# Patient Record
Sex: Female | Born: 1990 | Race: White | Hispanic: No | Marital: Single | Smoking: Never smoker
Health system: Southern US, Community
[De-identification: ages and names within clinical notes are randomized; demographics above are authoritative.]

## PROBLEM LIST (undated history)

## (undated) ENCOUNTER — Inpatient Hospital Stay (HOSPITAL_COMMUNITY): Payer: Self-pay

## (undated) DIAGNOSIS — A6000 Herpesviral infection of urogenital system, unspecified: Secondary | ICD-10-CM

## (undated) DIAGNOSIS — F32A Depression, unspecified: Secondary | ICD-10-CM

## (undated) DIAGNOSIS — F419 Anxiety disorder, unspecified: Secondary | ICD-10-CM

## (undated) DIAGNOSIS — G43909 Migraine, unspecified, not intractable, without status migrainosus: Secondary | ICD-10-CM

## (undated) DIAGNOSIS — F909 Attention-deficit hyperactivity disorder, unspecified type: Secondary | ICD-10-CM

## (undated) HISTORY — DX: Attention-deficit hyperactivity disorder, unspecified type: F90.9

## (undated) HISTORY — PX: NO PAST SURGERIES: SHX2092

## (undated) HISTORY — DX: Anxiety disorder, unspecified: F41.9

## (undated) HISTORY — DX: Migraine, unspecified, not intractable, without status migrainosus: G43.909

---
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
## 2014-10-13 ENCOUNTER — Ambulatory Visit (INDEPENDENT_AMBULATORY_CARE_PROVIDER_SITE_OTHER): Payer: BC Managed Care – PPO | Admitting: Medical

## 2014-10-13 VITALS — BP 120/81 | HR 73 | Temp 98.1°F | Ht 64.5 in | Wt 127.2 lb

## 2014-10-13 DIAGNOSIS — J0100 Acute maxillary sinusitis, unspecified: Secondary | ICD-10-CM

## 2014-10-13 DIAGNOSIS — J329 Chronic sinusitis, unspecified: Secondary | ICD-10-CM | POA: Insufficient documentation

## 2014-10-13 MED ORDER — AZITHROMYCIN 250 MG PO TABS: ORAL_TABLET | ORAL | Status: DC

## 2014-10-13 MED ORDER — FLUTICASONE PROPIONATE 50 MCG/ACT NA SUSP: 2.0000 | Freq: Every day | NASAL | Status: DC

## 2014-10-13 MED ORDER — BENZONATATE 100 MG PO CAPS: 100.0000 mg | ORAL_CAPSULE | Freq: Three times a day (TID) | ORAL | Status: DC | PRN

## 2014-10-13 NOTE — Assessment & Plan Note (Signed)
Your appear to have a sinus infection(with lt om as well). I am prescribing  cedinir antibiotic for the infection. To help with the nasal congestion I prescribed  flonase nasal steroid. For your associated cough, I prescribed cough medicine benzonatate.

## 2014-10-13 NOTE — Progress Notes (Signed)
Pre visit review using our clinic review tool, if applicable. No additional management support is needed unless otherwise documented below in the visit note. 

## 2014-10-13 NOTE — Patient Instructions (Addendum)
Your appear to have a sinus infection(with lt om as well). I am prescribing  Azithromycin  antibiotic for the infection. To help with the nasal congestion I prescribed  flonase nasal steroid. For your associated cough, I prescribed cough medicine benzonatate.  Rest, hydrate, tylenol for fever.  Follow up in 7 days or as needed.

## 2014-10-13 NOTE — Progress Notes (Signed)
   Subjective:    Patient ID: Denise [name], female    DOB: 1991-05-26, 23 y.o.   MRN: [id number]  HPI   Pt in today reporting  cough, nasal congestion, sneezing, itchy eyes  and runny nose for  3 wks. Pt states last week pressure in sinus and ears.   Pt denies history of allergies this time of year but some in the spring.  Associated symptoms( below yes or no)  Fever-no Chills-no Chest congestion-no Sneezing- yes Itching eyes-no Sore throat- faint mild sorethroat Post-nasal drainage-yes Wheezing-no Purulent nasal drainage-yes. Brownish yellow mucous. Fatigue-mild  LMP- 3 wks ago.  No past medical history on file.  History   Social History  . Marital Status: Single    Spouse Name: N/A    Number of Children: N/A  . Years of Education: N/A   Occupational History  . Not on file.   Social History Main Topics  . Smoking status: Not on file  . Smokeless tobacco: Not on file  . Alcohol Use: Not on file  . Drug Use: Not on file  . Sexual Activity: Not on file   Other Topics Concern  . Not on file   Social History Narrative  . No narrative on file    No past surgical history on file.  No family history on file.  Allergies  Allergen Reactions  . Cephalosporins     No current outpatient prescriptions on file prior to visit.   No current facility-administered medications on file prior to visit.    BP 120/81 mmHg  Pulse 73  Temp(Src) 98.1 F (36.7 C) (Oral)  Ht 5' 4.5" (1.638 m)  Wt 127 lb 3.2 oz (57.698 kg)  BMI 21.50 kg/m2  SpO2 100%  LMP 09/13/2014       Review of Systems  Constitutional: Negative for fever, chills and fatigue.  HENT: Positive for congestion, ear pain, postnasal drip, rhinorrhea, sinus pressure, sneezing and sore throat.   Eyes: Positive for itching.  Respiratory: Positive for cough. Negative for wheezing.   Cardiovascular: Negative for chest pain and palpitations.  Musculoskeletal: Negative for back pain and neck pain.    Neurological: Negative for dizziness and headaches.  Hematological: Negative for adenopathy. Does not bruise/bleed easily.       Objective:   Physical Exam  General  Mental Status - Alert. General Appearance - Well groomed. Not in acute distress.  Skin Rashes- No Rashes.  HEENT Head- Normal. Ear Auditory Canal - Left- Normal. Right - Normal.Tympanic Membrane- Left- Normal. Right- moderate bright red and buldge(the one can't hear from) Eye Sclera/Conjunctiva- Left- Normal. Right- Normal. Nose & Sinuses Nasal Mucosa- Left-  Boggy and Congested. Right-  Boggy and  Congested.Bilateral  Very faint maxillary and very faint frontal sinus pressure. Mouth & Throat Lips: Upper Lip- Normal: no dryness, cracking, pallor, cyanosis, or vesicular eruption. Lower Lip-Normal: no dryness, cracking, pallor, cyanosis or vesicular eruption. Buccal Mucosa- Bilateral- No Aphthous ulcers. Oropharynx- No Discharge or Erythema. Tonsils: Characteristics- Bilateral- No Erythema or Congestion. Size/Enlargement- Bilateral- No enlargement. Discharge- bilateral-None.  Neck Neck- Supple. No Masses.   Chest and Lung Exam Auscultation: Breath Sounds:-Clear even and unlabored.  Cardiovascular Auscultation:Rythm- Regular, rate and rhythm. Murmurs & Other Heart Sounds:Ausculatation of the heart reveal- No Murmurs.  Lymphatic Head & Neck General Head & Neck Lymphatics: Bilateral: Description- No Localized lymphadenopathy.         Assessment & Plan:

## 2014-12-21 ENCOUNTER — Ambulatory Visit: Payer: Self-pay | Admitting: Family Medicine

## 2014-12-21 ENCOUNTER — Telehealth: Payer: Self-pay | Admitting: Family Medicine

## 2014-12-21 NOTE — Telephone Encounter (Addendum)
Pt canceled new patient appointment today due to car breaking down

## 2014-12-21 NOTE — Telephone Encounter (Signed)
No fee per Beverely Lowabori, also she advised pt has to be scheduled out for new patient appt. Not an urgent work in.

## 2014-12-23 ENCOUNTER — Telehealth: Payer: Self-pay | Admitting: Family Medicine

## 2014-12-23 ENCOUNTER — Encounter: Payer: Self-pay | Admitting: Family Medicine

## 2014-12-23 NOTE — Telephone Encounter (Signed)
Disregard- show previous phone not regarding

## 2014-12-23 NOTE — Telephone Encounter (Signed)
PT was no show on 02/22 for new PT do you want me to reschedule/ do you want a no show fee?

## 2015-01-01 ENCOUNTER — Telehealth: Payer: Self-pay

## 2015-01-01 NOTE — Telephone Encounter (Signed)
Spoke with patient's mother, Clydie BraunKaren.  Mother states that patient is currently in school at [employer] in [place], [place] and will not be in until Sunday.  Mother was uncertain of patient's current cell number.

## 2015-01-04 ENCOUNTER — Ambulatory Visit (INDEPENDENT_AMBULATORY_CARE_PROVIDER_SITE_OTHER): Payer: BLUE CROSS/BLUE SHIELD | Admitting: Family Medicine

## 2015-01-04 ENCOUNTER — Encounter: Payer: Self-pay | Admitting: Family Medicine

## 2015-01-04 VITALS — BP 122/80 | HR 72 | Temp 98.0°F | Resp 16 | Ht 64.5 in | Wt 125.4 lb

## 2015-01-04 DIAGNOSIS — F909 Attention-deficit hyperactivity disorder, unspecified type: Secondary | ICD-10-CM | POA: Insufficient documentation

## 2015-01-04 DIAGNOSIS — Z30018 Encounter for initial prescription of other contraceptives: Secondary | ICD-10-CM

## 2015-01-04 DIAGNOSIS — IMO0001 Reserved for inherently not codable concepts without codable children: Secondary | ICD-10-CM | POA: Insufficient documentation

## 2015-01-04 DIAGNOSIS — F901 Attention-deficit hyperactivity disorder, predominantly hyperactive type: Secondary | ICD-10-CM

## 2015-01-04 DIAGNOSIS — L42 Pityriasis rosea: Secondary | ICD-10-CM

## 2015-01-04 MED ORDER — TRIAMCINOLONE ACETONIDE 0.1 % EX OINT: 1.0000 "application " | TOPICAL_OINTMENT | Freq: Two times a day (BID) | CUTANEOUS | Status: DC

## 2015-01-04 MED ORDER — ETONOGESTREL-ETHINYL ESTRADIOL 0.12-0.015 MG/24HR VA RING: VAGINAL_RING | VAGINAL | Status: DC

## 2015-01-04 NOTE — Assessment & Plan Note (Signed)
New to provider, ongoing for pt.  Doing well on current dose of Focalin.  Pt reports she does not need meds at this time and should be good until April.  Will refill at that time.

## 2015-01-04 NOTE — Patient Instructions (Signed)
Schedule your complete physical for sometime this summer (when you're home for break) Apply the Triamcinolone ointment twice daily on the itchy areas Insert the NuvaRing on Sunday 3/13.  Remove on April 3rd.  Insert new ring on April 10th. Call with any questions or concerns Welcome!  We're glad to have you!

## 2015-01-04 NOTE — Progress Notes (Signed)
   Subjective:    Patient ID: Denise [name], female    DOB: [birth year], 24 y.o.   MRN: [id number]  HPI New to establish.  Previous MD- Wynelle LinkSun  Skin lesions- pt was [facility] [place] (where she goes to school) and was dx'd w/ pityriasis rosea but pt has scaly, somewhat painful patches in her groin.  Hypopigmented areas on trunk- initially areas were salmon colored but pt is tanning for upcoming cruise and areas are now white.  ADD- chronic problem, currently on Focalin XR 10mg  daily.  Good control of sxs.  Intermittent palpitations.  No anorexia.  Some insomnia if she takes med too late.  Birth control- pt is interested in switching due to fact that pt will frequently forget pills despite setting an alarm.  Interested in NuvaRing.  No hx of blood clots.   Review of Systems For ROS see HPI   Reviewed meds, allergies, problem list, and PMH in chart     Objective:   Physical Exam  Constitutional: She is oriented to person, place, and time. She appears well-developed and well-nourished. No distress.  HENT:  Head: Normocephalic and atraumatic.  Neck: Normal range of motion. Neck supple. No thyromegaly present.  Cardiovascular: Normal rate, regular rhythm, normal heart sounds and intact distal pulses.   Pulmonary/Chest: Effort normal and breath sounds normal. No respiratory distress. She has no wheezes. She has no rales.  Musculoskeletal: She exhibits no edema.  Lymphadenopathy:    She has no cervical adenopathy.  Neurological: She is alert and oriented to person, place, and time.  Skin: Skin is warm and dry. Rash (pt w/ hypopigmented areas on trunk consistent w/ pityriasis rosea.  has areas in groin bilaterally that are heaped up and scaling around the edges and are more pink than white) noted. No erythema.  Vitals reviewed.         Assessment & Plan:

## 2015-01-04 NOTE — Assessment & Plan Note (Signed)
New.  Due to fact that pt frequently misses pills, she is interested in switching to the NuvaRing.  Discussed appropriate start date and how to use ring appropriately.  Pt has no hx of blood clots and is not a smoker.  Prescription given.

## 2015-01-04 NOTE — Progress Notes (Signed)
Pre visit review using our clinic review tool, if applicable. No additional management support is needed unless otherwise documented below in the visit note. 

## 2015-01-04 NOTE — Assessment & Plan Note (Signed)
New.  Agree that spots on trunk are consistent w/ pityriasis but her groin lesions are less consistent and are uncomfortable.  Start Triamcinolone ointment twice daily.  Reviewed supportive care and red flags that should prompt return.  Pt expressed understanding and is in agreement w/ plan.

## 2015-01-20 ENCOUNTER — Telehealth: Payer: Self-pay | Admitting: *Deleted

## 2015-01-20 NOTE — Telephone Encounter (Signed)
Medical records received via fax from [facility]. Forwarded to [facility] and Dr. Beverely Lowabori. JG//CMA

## 2015-03-18 ENCOUNTER — Telehealth: Payer: Self-pay | Admitting: Family Medicine

## 2015-03-18 NOTE — Telephone Encounter (Signed)
°  Caller name:Karen Relationship to patient:mother Can be [phone] Pharmacy:  Reason for call:Would like written rx for focalin xr 10 mg  She would like to pick up 03-23-2015 early am

## 2015-03-22 ENCOUNTER — Telehealth: Payer: Self-pay | Admitting: Family Medicine

## 2015-03-22 MED ORDER — DEXMETHYLPHENIDATE HCL ER 10 MG PO CP24: 10.0000 mg | ORAL_CAPSULE | Freq: Every day | ORAL | Status: DC

## 2015-03-22 NOTE — Telephone Encounter (Signed)
Relation to pt: daughter  Call back number: [phone]   Reason for call:  Pt is requesting a refill dexmethylphenidate (FOCALIN XR) 10 MG 24 hr capsule. Pt is returning back to school today and requesting to pick RX before 12. Advised pt in the future please give office a call prior to running out of medication pt voice understanding. Please advise

## 2015-03-22 NOTE — Telephone Encounter (Signed)
Ok for #30 

## 2015-03-22 NOTE — Telephone Encounter (Signed)
Last OV 01-04-15 focalin was placed on med list 12-11-14 no qty listed.

## 2015-03-22 NOTE — Telephone Encounter (Signed)
Med filled and pt notified available at the front desk for pick up.  

## 2015-04-16 ENCOUNTER — Telehealth: Payer: Self-pay | Admitting: Family Medicine

## 2015-04-16 NOTE — Telephone Encounter (Signed)
Can use Tuesday aug 30th at 11am.

## 2015-04-16 NOTE — Telephone Encounter (Signed)
Relation to pt: self  Call back number:[phone] Pharmacy:  Reason for call:  Pt would like to schedule physical as per 01/04/15 MSX:[id number] your complete physical for sometime this summer (when you're home for break). Pt prefers Monday or Tuesday.  Please advise

## 2015-04-19 ENCOUNTER — Ambulatory Visit (INDEPENDENT_AMBULATORY_CARE_PROVIDER_SITE_OTHER): Payer: BLUE CROSS/BLUE SHIELD | Admitting: Medical

## 2015-04-19 ENCOUNTER — Encounter: Payer: Self-pay | Admitting: General Practice

## 2015-04-19 ENCOUNTER — Encounter: Payer: Self-pay | Admitting: Medical

## 2015-04-19 VITALS — BP 135/78 | HR 81 | Temp 98.6°F | Ht 64.5 in | Wt 122.6 lb

## 2015-04-19 DIAGNOSIS — F909 Attention-deficit hyperactivity disorder, unspecified type: Secondary | ICD-10-CM | POA: Diagnosis not present

## 2015-04-19 DIAGNOSIS — G4700 Insomnia, unspecified: Secondary | ICD-10-CM | POA: Diagnosis not present

## 2015-04-19 MED ORDER — HYDROXYZINE HCL 25 MG PO TABS: ORAL_TABLET | ORAL | Status: DC

## 2015-04-19 MED ORDER — DEXMETHYLPHENIDATE HCL ER 10 MG PO CP24: 10.0000 mg | ORAL_CAPSULE | Freq: Every day | ORAL | Status: DC

## 2015-04-19 NOTE — Progress Notes (Signed)
Pre visit review using our clinic review tool, if applicable. No additional management support is needed unless otherwise documented below in the visit note. 

## 2015-04-19 NOTE — Patient Instructions (Signed)
ADHD (attention deficit hyperactivity disorder) Refill of her focalin today. Pt did sign control med contract today. She can call back in a month to see if pcp wants official visit or if she can just pick up next month refill.  Insomnia Sleep hygiene discussed and rx of hydroxyzine to use if needed.   Pt signed control med contract. Will put it in for scanning.  Follow up 1 month or as needed

## 2015-04-19 NOTE — Assessment & Plan Note (Signed)
Refill of her focalin today. Pt did sign control med contract today. She can call back in a month to see if pcp wants official visit or if she can just pick up next month refill.

## 2015-04-19 NOTE — Telephone Encounter (Signed)
Pt scheduled for aug 30th at 11am. Thank you

## 2015-04-19 NOTE — Progress Notes (Signed)
   Subjective:    Patient ID: Denise [name], female    DOB: [birth year], 24 y.o.   MRN: [id number]  HPI  Pt in for prescription of focalin. She has history ADHD. Pt has been on medication since 2nd grade. Pt doing well in school/College. She is at [employer]. Pt is studying radiology.  Pt states that if she misses occaional day would notice concentration decrease. Takes medication in am.  Pt also mentions that she has some problems going to sleeping. This has been going on for a month or two. Pt has trouble about every other night. Pt does work sometimes late 11 pm- midnight. Production designer, theatre/television/film at [employer].   LMP- last week.      Review of Systems  Constitutional: Negative for fever, chills and fatigue.  Respiratory: Negative for cough, chest tightness, shortness of breath and wheezing.   Cardiovascular: Negative for chest pain and palpitations.  Gastrointestinal: Negative for abdominal pain.  Neurological: Negative for dizziness, seizures, weakness, numbness and headaches.  Hematological: Negative for adenopathy. Does not bruise/bleed easily.  Psychiatric/Behavioral: Positive for sleep disturbance and decreased concentration. Negative for hallucinations, behavioral problems, confusion and dysphoric mood. The patient is not nervous/anxious.     Past Medical History  Diagnosis Date  . ADHD (attention deficit hyperactivity disorder)     History   Social History  . Marital Status: Single    Spouse Name: N/A  . Number of Children: N/A  . Years of Education: N/A   Occupational History  . Not on file.   Social History Main Topics  . Smoking status: Never Smoker   . Smokeless tobacco: Never Used  . Alcohol Use: Yes  . Drug Use: No  . Sexual Activity: Yes   Other Topics Concern  . Not on file   Social History Narrative    No past surgical history on file.  No family history on file.  Allergies  Allergen Reactions  . Cephalosporins     Current Outpatient Prescriptions  on File Prior to Visit  Medication Sig Dispense Refill  . dexmethylphenidate (FOCALIN XR) 10 MG 24 hr capsule Take 1 capsule (10 mg total) by mouth daily. 30 capsule 0  . etonogestrel-ethinyl estradiol (NUVARING) 0.12-0.015 MG/24HR vaginal ring Insert vaginally and leave in place for 3 consecutive weeks, then remove for 1 week. 1 each 12  . triamcinolone ointment (KENALOG) 0.1 % Apply 1 application topically 2 (two) times daily. 90 g 1   No current facility-administered medications on file prior to visit.    BP 135/78 mmHg  Pulse 81  Temp(Src) 98.6 F (37 C) (Oral)  Ht 5' 4.5" (1.638 m)  Wt 122 lb 9.6 oz (55.611 kg)  BMI 20.73 kg/m2  SpO2 100%  LMP 04/12/2015       Objective:   Physical Exam  General Mental Status- Alert. General Appearance- Not in acute distress.   Skin General: Color- Normal Color. Moisture- Normal Moisture.  Neck Carotid Arteries- Normal color. Moisture- Normal Moisture.Marland Kitchen No JVD.  Chest and Lung Exam Auscultation: Breath Sounds:-Normal. CTA.  Cardiovascular Auscultation:Rythm- Regular, Rate and Rhythm. Murmurs & Other Heart Sounds:Auscultation of the heart reveals- No Murmurs.    Neurologic Cranial Nerve exam:- CN III-XII intact(No nystagmus), symmetric smile. Drift Test:- No drift. Romberg Exam:- Negative.  Heal to Toe Gait exam:-Normal. Finger to Nose:- Normal/Intact Strength:- 5/5 equal and symmetric strength both upper and lower extremities.      Assessment & Plan:

## 2015-04-19 NOTE — Assessment & Plan Note (Signed)
Sleep hygiene discussed and rx of hydroxyzine to use if needed.

## 2015-04-20 NOTE — Telephone Encounter (Signed)
By the time the patient returned call Aug 30th at 11am has been taken. Any other dates ?

## 2015-04-21 NOTE — Telephone Encounter (Signed)
LVM awaiting pt to call back to scheduled physical

## 2015-04-21 NOTE — Telephone Encounter (Signed)
Pt can be notified that it is ok to wait for fall break. Ok to use an 11am. This is not urgent to schedule.

## 2015-04-23 ENCOUNTER — Encounter: Payer: BLUE CROSS/BLUE SHIELD | Admitting: Family Medicine

## 2015-05-21 ENCOUNTER — Telehealth: Payer: Self-pay | Admitting: Family Medicine

## 2015-05-21 MED ORDER — DEXMETHYLPHENIDATE HCL ER 10 MG PO CP24: 10.0000 mg | ORAL_CAPSULE | Freq: Every day | ORAL | Status: DC

## 2015-05-21 NOTE — Telephone Encounter (Signed)
Ok for #30, no refills.  Needs UDS 

## 2015-05-21 NOTE — Telephone Encounter (Signed)
Caller name: Leonila Speranza  Relation to pt: self  Call back [phone] Pharmacy: [facility] [address], [place], [place] [address] [phone]    Reason for call:  Patient requesting a refill dexmethylphenidate (FOCALIN XR) 10 MG 24 hr capsule. Please send to new pharmacy

## 2015-05-21 NOTE — Telephone Encounter (Signed)
Rx printed, MD signed, and placed at front desk for pick-up.  Left message on voicemail to notify patient.

## 2015-05-21 NOTE — Telephone Encounter (Signed)
Last refilled 04/19/15 for 30 with 0 Last seen by Esperanza Richters, PA  04/19/15 for ADD Controlled Substance contract signed 04/19/15, no UDS Please advise.

## 2015-06-09 ENCOUNTER — Other Ambulatory Visit: Payer: Self-pay

## 2015-06-09 NOTE — Telephone Encounter (Signed)
Pt was refilled on 7/22- too early

## 2015-06-10 NOTE — Telephone Encounter (Signed)
Faxed pharmacy regarding early refill refused.

## 2015-10-21 ENCOUNTER — Encounter: Payer: BLUE CROSS/BLUE SHIELD | Admitting: Family Medicine

## 2015-10-26 ENCOUNTER — Telehealth: Payer: Self-pay | Admitting: Family Medicine

## 2015-10-26 NOTE — Telephone Encounter (Signed)
Pt never picked up rx dated for 05-21-15, rx shredded

## 2016-09-13 LAB — OB RESULTS CONSOLE HGB/HCT, BLOOD
HEMATOCRIT: 40 %
Hemoglobin: 13.1 g/dL

## 2016-09-13 LAB — OB RESULTS CONSOLE RPR: RPR: NONREACTIVE

## 2016-09-13 LAB — OB RESULTS CONSOLE GC/CHLAMYDIA
Chlamydia: NEGATIVE
GC PROBE AMP, GENITAL: NEGATIVE

## 2016-09-13 LAB — OB RESULTS CONSOLE HEPATITIS B SURFACE ANTIGEN: Hepatitis B Surface Ag: NEGATIVE

## 2016-09-13 LAB — OB RESULTS CONSOLE HIV ANTIBODY (ROUTINE TESTING): HIV: NONREACTIVE

## 2016-09-13 LAB — OB RESULTS CONSOLE RUBELLA ANTIBODY, IGM: Rubella: NON-IMMUNE/NOT IMMUNE

## 2016-09-13 LAB — OB RESULTS CONSOLE ABO/RH: RH Type: NEGATIVE

## 2016-09-13 LAB — OB RESULTS CONSOLE ANTIBODY SCREEN: Antibody Screen: NEGATIVE

## 2016-09-13 LAB — OB RESULTS CONSOLE PLATELET COUNT: PLATELETS: 275 10*3/uL

## 2016-09-25 LAB — CYTOLOGY - PAP: Pap: NEGATIVE

## 2016-10-16 ENCOUNTER — Ambulatory Visit: Payer: BLUE CROSS/BLUE SHIELD | Admitting: *Deleted

## 2016-10-16 ENCOUNTER — Ambulatory Visit: Payer: BLUE CROSS/BLUE SHIELD

## 2016-10-30 NOTE — L&D Delivery Note (Signed)
26 y.o. G1P0000 at 8578w5d delivered a viable female infant in cephalic, OP position. anterior shoulder delivered with ease. 60 sec delayed cord clamping. Cord clamped x2 and cut. Placenta delivered spontaneously intact, with 3VC. Fundus firm on exam with massage and pitocin. Good hemostasis noted.  Anesthesia: Epidural Laceration: 2nd degree perineal extending to left labia Suture: 3.0 Vicryl Good hemostasis noted. EBL: 200cc  Mom and baby recovering in LDR.    Apgars: APGAR (1 MIN): 8   APGAR (5 MINS): 9    Weight: Pending skin to skin  Sponge and instrument count were correct x2. Placenta sent to L&D pathology.  Howard PouchLauren Feng, MD PGY-1 Family Medicine 04/18/2017, 8:46 PM  OB FELLOW DELIVERY ATTESTATION  I was gloved and present for the delivery in its entirety, and I agree with the above resident's note.    Jen MowElizabeth Mumaw, DO OB Fellow

## 2016-11-06 ENCOUNTER — Encounter: Payer: Self-pay | Admitting: *Deleted

## 2016-11-06 ENCOUNTER — Encounter: Payer: BLUE CROSS/BLUE SHIELD | Attending: Obstetrics and Gynecology | Admitting: *Deleted

## 2016-11-06 DIAGNOSIS — Z3490 Encounter for supervision of normal pregnancy, unspecified, unspecified trimester: Secondary | ICD-10-CM | POA: Diagnosis not present

## 2016-11-06 DIAGNOSIS — Z713 Dietary counseling and surveillance: Secondary | ICD-10-CM | POA: Diagnosis not present

## 2016-11-06 DIAGNOSIS — Z789 Other specified health status: Secondary | ICD-10-CM

## 2016-11-06 NOTE — Progress Notes (Signed)
  Medical Nutrition Therapy:  Appt start time: 1115 end time:  1200.   Assessment:  Primary concerns today: Denise [name] is here for nutrition counseling pertaining to referral for vegetarian diet while pregnant.  Denise [name] wants to make sure she is getting enough nutrients.  She has been a vegetarian since age 89.  She has not been educated on vegetarian diets.  04/20/17 due date.  Has lost weight 3 lb unintentionally.  She was struggling with vomiting until 2 weeks ago.  She threw up multiple times daily.   She didn't eat well before and felt terribly.  Now she is eating more regularly.  Eats 3 meals and 1 snacks.  She lives with mom who does the grocery shopping and the cooking.  Mom eats meat, but will cook vegetarian for [facility].  She just moved back in with mom for a month.  She eats out most days: [employer] potato soft taco (no beans), fries, [place] grits and biscuit or hashbrown.  Is trying to limit soda.  States she is a picky eater.  Doesn't eat seafood and doesn't like certain vegetables.  Like rice green beans, greens, soup, PB sandwiches, yogurt, salad.  Will consume dairy, but not eggs. Loves [place] food.    Preferred Learning Style:   No preference indicated   Learning Readiness:   Ready   MEDICATIONS: pnv   DIETARY INTAKE: Usual eating pattern includes 3 meals and 1 snacks per day.  Avoided foods include meat, tofu, seafood, eggs, brussel sprouts.    24-hr recall:  B ( AM): cheerios with whole milk  Snk ( AM): CIB L ( PM): minestrone soup Snk ( PM): none D ( PM): cheese fries Snk ( PM): yogurt parfait (vanilla yogurt, frozen fruit, granola) Beverages: water  Usual physical activity: treadmill 3 days/week.       Nutritional Diagnosis:  NI-5.7.1 Inadequate protein intake As related to vegetarian diet.  As evidenced by dietary recall.    Intervention:  Nutrition counseling provided.  Discussed need for increased protein, zinc, iron, magnesium, and overall balanced  nutrition. Discussed alternative protein sources, increasing fruits and vegetables and whole grains, and making more nutrient-rich choices when eating out.    Teaching Method Utilized:  Auditory   Handouts given during visit include:  MNT for vegetarian while pregnant  Barriers to learning/adherence to lifestyle change: none  Demonstrated degree of understanding via:  Teach Back   Monitoring/Evaluation:  Dietary intake, exercise, labs, and body weight prn.

## 2016-11-06 NOTE — Patient Instructions (Signed)
Check out [employer] products Eat protein with all meals and snacks Bars and carnation are fine Other sources are beans (get at [employer]), nuts and peanut butter, dairy Drink lots of water Eat a variety of fruits and vegetable Try whole grains like brown rice, whole wheat bread Add second snack

## 2016-11-21 ENCOUNTER — Encounter: Payer: Self-pay | Admitting: Obstetrics

## 2016-11-21 ENCOUNTER — Ambulatory Visit (INDEPENDENT_AMBULATORY_CARE_PROVIDER_SITE_OTHER): Payer: BLUE CROSS/BLUE SHIELD | Admitting: Obstetrics

## 2016-11-21 VITALS — BP 113/74 | HR 90 | Wt 125.6 lb

## 2016-11-21 DIAGNOSIS — Z3490 Encounter for supervision of normal pregnancy, unspecified, unspecified trimester: Secondary | ICD-10-CM | POA: Insufficient documentation

## 2016-11-21 DIAGNOSIS — Z3482 Encounter for supervision of other normal pregnancy, second trimester: Secondary | ICD-10-CM

## 2016-11-21 DIAGNOSIS — Z3480 Encounter for supervision of other normal pregnancy, unspecified trimester: Secondary | ICD-10-CM

## 2016-11-21 NOTE — Progress Notes (Signed)
Subjective:    Denise [name] is a 26 y.o. female being seen today for her obstetrical visit. She is at 8931w4d gestation. Patient reports: no complaints . Fetal movement: normal.  Problem List Items Addressed This Visit    None     Patient Active Problem List   Diagnosis Date Noted  . Supervision of normal pregnancy, antepartum 11/21/2016  . Insomnia 04/19/2015  . ADHD (attention deficit hyperactivity disorder) 01/04/2015  . Pityriasis rosea 01/04/2015  . Birth control 01/04/2015  . Sinus infection 10/13/2014   Objective:    BP 113/74   Pulse 90   Wt 125 lb 9.6 oz (57 kg)   BMI 21.23 kg/m  FHT: 150 BPM  Uterine Size: size equals dates     Assessment:    Pregnancy @ 4031w4d    Plan:    Signs and symptoms of preterm labor: discussed.  Labs, problem list reviewed and updated 2 hr GTT planned Follow up in 4 weeks.  Patient ID: Denise [name], female   DOB: [birth year], 26 y.o.   MRN: [id number]

## 2016-11-21 NOTE — Progress Notes (Signed)
Patient is doing well and has no concerns.Patient needs to reschedule her anatomy ultrasound.

## 2016-11-22 ENCOUNTER — Encounter: Payer: Self-pay | Admitting: *Deleted

## 2016-11-22 ENCOUNTER — Encounter: Payer: BLUE CROSS/BLUE SHIELD | Admitting: Obstetrics & Gynecology

## 2016-11-29 ENCOUNTER — Ambulatory Visit (HOSPITAL_COMMUNITY)
Admission: RE | Admit: 2016-11-29 | Discharge: 2016-11-29 | Disposition: A | Discharge status: Home / Self Care | Payer: BLUE CROSS/BLUE SHIELD | Source: Ambulatory Visit | Attending: Obstetrics | Admitting: Obstetrics

## 2016-11-29 ENCOUNTER — Other Ambulatory Visit: Payer: Self-pay | Admitting: Obstetrics

## 2016-11-29 DIAGNOSIS — Z363 Encounter for antenatal screening for malformations: Secondary | ICD-10-CM | POA: Diagnosis not present

## 2016-11-29 DIAGNOSIS — Z3A19 19 weeks gestation of pregnancy: Secondary | ICD-10-CM | POA: Diagnosis not present

## 2016-11-29 DIAGNOSIS — Z3480 Encounter for supervision of other normal pregnancy, unspecified trimester: Secondary | ICD-10-CM

## 2016-11-30 ENCOUNTER — Encounter: Payer: Self-pay | Admitting: *Deleted

## 2016-11-30 DIAGNOSIS — Z3490 Encounter for supervision of normal pregnancy, unspecified, unspecified trimester: Secondary | ICD-10-CM

## 2016-12-08 ENCOUNTER — Telehealth: Payer: Self-pay

## 2016-12-08 NOTE — Telephone Encounter (Signed)
Pt c/o possible stomach bug. Initiate the SUPERVALU INCBRAT diet, increase fluid intake, Tylenol as directed for pain, Imodium as directed for diarrhea and allow virus to run it's course. Contact MAU if sx's worsen or feels dehydrated.

## 2016-12-19 ENCOUNTER — Encounter: Payer: Self-pay | Admitting: Obstetrics

## 2016-12-19 ENCOUNTER — Ambulatory Visit (INDEPENDENT_AMBULATORY_CARE_PROVIDER_SITE_OTHER): Payer: BLUE CROSS/BLUE SHIELD | Admitting: Obstetrics

## 2016-12-19 VITALS — BP 114/70 | HR 98 | Wt 128.0 lb

## 2016-12-19 DIAGNOSIS — B3731 Acute candidiasis of vulva and vagina: Secondary | ICD-10-CM

## 2016-12-19 DIAGNOSIS — O98812 Other maternal infectious and parasitic diseases complicating pregnancy, second trimester: Secondary | ICD-10-CM

## 2016-12-19 DIAGNOSIS — B373 Candidiasis of vulva and vagina: Secondary | ICD-10-CM

## 2016-12-19 DIAGNOSIS — Z3480 Encounter for supervision of other normal pregnancy, unspecified trimester: Secondary | ICD-10-CM

## 2016-12-19 MED ORDER — FLUCONAZOLE 150 MG PO TABS
150.0000 mg | ORAL_TABLET | Freq: Once | ORAL | 0 refills | Status: AC
Start: 2016-12-19 — End: 2016-12-19

## 2016-12-19 NOTE — Progress Notes (Signed)
Patient thinks she has a yeast infection. Patient also has a severe cold.

## 2016-12-19 NOTE — Progress Notes (Signed)
Subjective:  Denise [name] is a 26 y.o. G1P0 at 685w4d being seen today for ongoing prenatal care.  She is currently monitored for the following issues for this low-risk pregnancy and has Sinus infection; ADHD (attention deficit hyperactivity disorder); Pityriasis rosea; Birth control; Insomnia; and Supervision of normal pregnancy, antepartum on her problem list.  Patient reports vaginal irritation and URI.  Contractions: Not present. Vag. Bleeding: None.  Movement: Present. Denies leaking of fluid.   The following portions of the patient's history were reviewed and updated as appropriate: allergies, current medications, past family history, past medical history, past social history, past surgical history and problem list. Problem list updated.  Objective:   Vitals:   12/19/16 1049  BP: 114/70  Pulse: 98  Weight: 128 lb (58.1 kg)    Fetal Status:     Movement: Present     General:  Alert, oriented and cooperative. Patient is in no acute distress.  Skin: Skin is warm and dry. No rash noted.   Cardiovascular: Normal heart rate noted  Respiratory: Normal respiratory effort, no problems with respiration noted  Abdomen: Soft, gravid, appropriate for gestational age. Pain/Pressure: Absent     Pelvic:  Cervical exam deferred        Extremities: Normal range of motion.  Edema: None  Mental Status: Normal mood and affect. Normal behavior. Normal judgment and thought content.   Urinalysis:      Assessment and Plan:  1. Pregnancy: G1P0 at 705w4d  2. URI -comfort measures recommended  There are no diagnoses linked to this encounter. Preterm labor symptoms and general obstetric precautions including but not limited to vaginal bleeding, contractions, leaking of fluid and fetal movement were reviewed in detail with the patient. Please refer to After Visit Summary for other counseling recommendations.  F/U 4 weeks   Brock Badharles A Harper, MDPatient ID: Denise [name], female   DOB: October 02, 1991,  26 y.o.   MRN: [id number]

## 2016-12-23 ENCOUNTER — Encounter (HOSPITAL_COMMUNITY): Payer: Self-pay | Admitting: *Deleted

## 2016-12-23 ENCOUNTER — Inpatient Hospital Stay (HOSPITAL_COMMUNITY)
Admission: AD | Admit: 2016-12-23 | Discharge: 2016-12-23 | Disposition: A | Discharge status: Home / Self Care | Payer: BLUE CROSS/BLUE SHIELD | Source: Ambulatory Visit | Attending: Obstetrics and Gynecology | Admitting: Obstetrics and Gynecology

## 2016-12-23 DIAGNOSIS — F909 Attention-deficit hyperactivity disorder, unspecified type: Secondary | ICD-10-CM | POA: Diagnosis not present

## 2016-12-23 DIAGNOSIS — N898 Other specified noninflammatory disorders of vagina: Secondary | ICD-10-CM | POA: Insufficient documentation

## 2016-12-23 DIAGNOSIS — F419 Anxiety disorder, unspecified: Secondary | ICD-10-CM | POA: Insufficient documentation

## 2016-12-23 DIAGNOSIS — O99342 Other mental disorders complicating pregnancy, second trimester: Secondary | ICD-10-CM | POA: Insufficient documentation

## 2016-12-23 DIAGNOSIS — Z3A23 23 weeks gestation of pregnancy: Secondary | ICD-10-CM | POA: Diagnosis not present

## 2016-12-23 DIAGNOSIS — O26892 Other specified pregnancy related conditions, second trimester: Secondary | ICD-10-CM | POA: Diagnosis not present

## 2016-12-23 LAB — WET PREP, GENITAL
CLUE CELLS WET PREP: NONE SEEN
SPERM: NONE SEEN
TRICH WET PREP: NONE SEEN
YEAST WET PREP: NONE SEEN

## 2016-12-23 LAB — URINALYSIS, ROUTINE W REFLEX MICROSCOPIC
BILIRUBIN URINE: NEGATIVE
Glucose, UA: NEGATIVE mg/dL
Hgb urine dipstick: NEGATIVE
KETONES UR: NEGATIVE mg/dL
NITRITE: NEGATIVE
PROTEIN: NEGATIVE mg/dL
Specific Gravity, Urine: 1.002 — ABNORMAL LOW (ref 1.005–1.030)
pH: 8 (ref 5.0–8.0)

## 2016-12-23 NOTE — MAU Provider Note (Signed)
  History     CSN: [id number]  Arrival date and time: 12/23/16 1123   None     Chief Complaint  Patient presents with  . Vaginal Discharge   Patient is a 26 y/o G1P0 at 23wks and 1 day who presents with concerns for vaginal discharge. She was seen in the office yesterday and told her provider she had vaginal discharge and itching. She received a dose of fluconazole. Overnight she reports a copious amount of greenish thick vaginal discharge in her panties when she woke up this AM. She reports no vaginal discharge, no contractions or abdominal pain. She reports regular fetal movement. She has history of HSV 1.     OB History    Gravida Para Term Preterm AB Living   1             SAB TAB Ectopic Multiple Live Births                  Past Medical History:  Diagnosis Date  . ADHD (attention deficit hyperactivity disorder)   . Anxiety     History reviewed. No pertinent surgical history.  Family History  Problem Relation Age of Onset  . Multiple sclerosis Mother   . Cancer Mother   . Cancer Other   . Stroke Other     Social History  Substance Use Topics  . Smoking status: Never Smoker  . Smokeless tobacco: Never Used  . Alcohol use No    Allergies:  Allergies  Allergen Reactions  . Cephalosporins     Prescriptions Prior to Admission  Medication Sig Dispense Refill Last Dose  . Prenatal Vit-Fe Fumarate-FA (PRENATAL MULTIVITAMIN) TABS tablet Take 1 tablet by mouth daily at 12 noon.   Taking    Review of Systems  Constitutional: Negative for chills and fever.  HENT: Negative for congestion and rhinorrhea.   Respiratory: Negative for cough and shortness of breath.   Cardiovascular: Negative for chest pain and leg swelling.  Gastrointestinal: Negative for abdominal distention, abdominal pain, constipation, diarrhea, nausea and vomiting.  Genitourinary: Negative for difficulty urinating, dysuria and flank pain.  Neurological: Negative for dizziness, weakness and  light-headedness.   Physical Exam   Blood pressure 117/59, pulse 87, temperature 98.4 F (36.9 C), temperature source Oral, resp. rate 16, height 5\' 3"  (1.6 m), weight 127 lb 1.9 oz (57.7 kg).  Physical Exam  Constitutional: She is oriented to person, place, and time. She appears well-developed and well-nourished.  HENT:  Head: Normocephalic and atraumatic.  Respiratory: Effort normal. No respiratory distress.  GI: Soft. Bowel sounds are normal. She exhibits no distension. There is no rebound.  Genitourinary:  Genitourinary Comments: Small amount of whitish discharge in vaginal vault. Cervix visually and digitally closed. No blood in vaginal vault. No cervical motion tenderness.   Neurological: She is alert and oriented to person, place, and time.  Skin: Skin is warm and dry.  Psychiatric: She has a normal mood and affect. Her behavior is normal.    MAU Course  Procedures  MDM In [place] patient -had vaginal exam which was unremarkable -Wet prep which was negative -GC/CT which is pending.   Assessment and Plan  Vaginal Discharge: Negative wet prep await GC/CT cultures. No intervention at this time. Return precautions given for bleeding, decreased fetal movement or contractions.   Ernestina Pennaicholas Carlton Sweaney 12/23/2016, 12:22 PM

## 2016-12-23 NOTE — MAU Note (Signed)
Saw doctor on Wednesday was given Diflucan for yeast, last night had large amount of vaginal discharge, denies bleeding, still having discharge, has HSV 1 having a flare up.

## 2016-12-23 NOTE — Discharge Instructions (Signed)
Cervicitis Cervicitis is when the cervix gets irritated and swollen. Your cervix is the lower end of your uterus. Follow these instructions at home:  Do not have sex until your doctor says it is okay.  Take over-the-counter and prescription medicines only as told by your doctor.  If you were prescribed an antibiotic medicine, take it as told by your doctor. Do not stop taking it even if you start to feel better.  Keep all follow-up visits as told by your doctor. This is important. Contact a doctor if:  Your symptoms come back after treatment.  Your symptoms get worse after treatment.  You have a fever.  You feel tired (fatigued).  Your belly (abdomen) hurts.  You feel like you are going to throw up (are nauseous).  You throw up (vomit).  You have watery poop (diarrhea).  Your back hurts. Get help right away if:  You have very bad pain in your belly, and medicine does not help it.  You cannot pee (urinate). Summary  Cervicitis is when the cervix gets irritated and swollen.  Do not have sex until your doctor says it is okay.  If you need to take an antibiotic, do not stop taking even if you start to feel better. Take medicines only as told by your doctor. This information is not intended to replace advice given to you by your health care provider. Make sure you discuss any questions you have with your health care provider. Document Released: 07/25/2008 Document Revised: 07/02/2016 Document Reviewed: 07/02/2016 Elsevier Interactive Patient Education  2017 Elsevier Inc.  

## 2016-12-25 LAB — GC/CHLAMYDIA PROBE AMP (~~LOC~~) NOT AT ARMC
Chlamydia: NEGATIVE
Neisseria Gonorrhea: NEGATIVE

## 2017-01-16 ENCOUNTER — Ambulatory Visit (INDEPENDENT_AMBULATORY_CARE_PROVIDER_SITE_OTHER): Payer: Medicaid Other | Admitting: Obstetrics

## 2017-01-16 ENCOUNTER — Encounter: Payer: Self-pay | Admitting: Obstetrics

## 2017-01-16 ENCOUNTER — Encounter: Payer: Self-pay | Admitting: *Deleted

## 2017-01-16 DIAGNOSIS — Z3402 Encounter for supervision of normal first pregnancy, second trimester: Secondary | ICD-10-CM

## 2017-01-16 DIAGNOSIS — O360920 Maternal care for other rhesus isoimmunization, second trimester, not applicable or unspecified: Secondary | ICD-10-CM

## 2017-01-16 DIAGNOSIS — Z3480 Encounter for supervision of other normal pregnancy, unspecified trimester: Secondary | ICD-10-CM

## 2017-01-16 DIAGNOSIS — Z6791 Unspecified blood type, Rh negative: Secondary | ICD-10-CM | POA: Insufficient documentation

## 2017-01-16 DIAGNOSIS — O26899 Other specified pregnancy related conditions, unspecified trimester: Secondary | ICD-10-CM | POA: Insufficient documentation

## 2017-01-16 NOTE — Progress Notes (Signed)
Subjective:  Denise [name] is a 26 y.o. G1P0 at 4159w4d being seen today for ongoing prenatal care.  She is currently monitored for the following issues for this low-risk pregnancy and has Sinus infection; ADHD (attention deficit hyperactivity disorder); Pityriasis rosea; Birth control; Insomnia; Supervision of normal pregnancy, antepartum; and Rh negative, antepartum on her problem list.  Patient reports no complaints.  Contractions: Irregular. Vag. Bleeding: None.  Movement: Present. Denies leaking of fluid.   The following portions of the patient's history were reviewed and updated as appropriate: allergies, current medications, past family history, past medical history, past social history, past surgical history and problem list. Problem list updated.  Objective:   Vitals:   01/16/17 1046  BP: 117/76  Pulse: 82  Weight: 133 lb 12.8 oz (60.7 kg)    Fetal Status: Fetal Heart Rate (bpm): 150   Movement: Present     General:  Alert, oriented and cooperative. Patient is in no acute distress.  Skin: Skin is warm and dry. No rash noted.   Cardiovascular: Normal heart rate noted  Respiratory: Normal respiratory effort, no problems with respiration noted  Abdomen: Soft, gravid, appropriate for gestational age. Pain/Pressure: Absent     Pelvic:  Cervical exam deferred        Extremities: Normal range of motion.  Edema: None  Mental Status: Normal mood and affect. Normal behavior. Normal judgment and thought content.   Urinalysis:      Assessment and Plan:  Pregnancy: G1P0 at 2759w4d  1. Supervision of other normal pregnancy, antepartum   2. Rh negative, antepartum Rx: - Rhogam ar 28 weeks  Preterm labor symptoms and general obstetric precautions including but not limited to vaginal bleeding, contractions, leaking of fluid and fetal movement were reviewed in detail with the patient. Please refer to After Visit Summary for other counseling recommendations.  F/U in 2 weeks   Brock Badharles  A Harper, MDPatient ID: Denise [name], female   DOB: 05-10-1991, 26 y.o.   MRN: [id number]

## 2017-01-16 NOTE — Progress Notes (Signed)
Pt presents for ROB requests note to be [employer] [place] [employer].

## 2017-01-29 ENCOUNTER — Ambulatory Visit (INDEPENDENT_AMBULATORY_CARE_PROVIDER_SITE_OTHER): Payer: BLUE CROSS/BLUE SHIELD | Admitting: Obstetrics & Gynecology

## 2017-01-29 ENCOUNTER — Other Ambulatory Visit: Payer: Medicaid Other

## 2017-01-29 VITALS — BP 127/71 | HR 88 | Wt 135.2 lb

## 2017-01-29 DIAGNOSIS — Z23 Encounter for immunization: Secondary | ICD-10-CM

## 2017-01-29 DIAGNOSIS — O99613 Diseases of the digestive system complicating pregnancy, third trimester: Secondary | ICD-10-CM

## 2017-01-29 DIAGNOSIS — Z3480 Encounter for supervision of other normal pregnancy, unspecified trimester: Secondary | ICD-10-CM

## 2017-01-29 DIAGNOSIS — K219 Gastro-esophageal reflux disease without esophagitis: Secondary | ICD-10-CM

## 2017-01-29 DIAGNOSIS — Z6791 Unspecified blood type, Rh negative: Secondary | ICD-10-CM

## 2017-01-29 DIAGNOSIS — O360930 Maternal care for other rhesus isoimmunization, third trimester, not applicable or unspecified: Secondary | ICD-10-CM

## 2017-01-29 DIAGNOSIS — Z3483 Encounter for supervision of other normal pregnancy, third trimester: Secondary | ICD-10-CM

## 2017-01-29 DIAGNOSIS — O26899 Other specified pregnancy related conditions, unspecified trimester: Secondary | ICD-10-CM

## 2017-01-29 DIAGNOSIS — O219 Vomiting of pregnancy, unspecified: Secondary | ICD-10-CM

## 2017-01-29 MED ORDER — FAMOTIDINE 20 MG PO TABS: 20.0000 mg | ORAL_TABLET | Freq: Two times a day (BID) | ORAL | 5 refills | Status: DC

## 2017-01-29 MED ORDER — METOCLOPRAMIDE HCL 10 MG PO TABS: 10.0000 mg | ORAL_TABLET | Freq: Four times a day (QID) | ORAL | 5 refills | Status: DC | PRN

## 2017-01-29 MED ORDER — ONDANSETRON 4 MG PO TBDP: 4.0000 mg | ORAL_TABLET | Freq: Four times a day (QID) | ORAL | 5 refills | Status: DC | PRN

## 2017-01-29 MED ORDER — RHO D IMMUNE GLOBULIN 1500 UNIT/2ML IJ SOSY
300.0000 ug | PREFILLED_SYRINGE | Freq: Once | INTRAMUSCULAR | Status: AC
  Administered 2017-01-29: 300 ug via INTRAMUSCULAR

## 2017-01-29 NOTE — Progress Notes (Signed)
   PRENATAL VISIT NOTE  Subjective:  Denise [name] is a 26 y.o. G1P0 at [redacted]w[redacted]d being seen today for ongoing prenatal care.  She is currently monitored for the following issues for this low-risk pregnancy and has Sinus infection; ADHD (attention deficit hyperactivity disorder); Pityriasis rosea; Birth control; Insomnia; Supervision of normal pregnancy, antepartum; and Rh negative, antepartum on her problem list.  Patient reports heartburn, nausea and vomiting that is getting worse, wants medication.  Contractions: Not present. Vag. Bleeding: None.  Movement: Present. Denies leaking of fluid.   The following portions of the patient's history were reviewed and updated as appropriate: allergies, current medications, past family history, past medical history, past social history, past surgical history and problem list. Problem list updated.  Objective:   Vitals:   01/29/17 0924  BP: 127/71  Pulse: 88  Weight: 135 lb 3.2 oz (61.3 kg)    Fetal Status: Fetal Heart Rate (bpm): 145 Fundal Height: 28 cm Movement: Present     General:  Alert, oriented and cooperative. Patient is in no acute distress.  Skin: Skin is warm and dry. No rash noted.   Cardiovascular: Normal heart rate noted  Respiratory: Normal respiratory effort, no problems with respiration noted  Abdomen: Soft, gravid, appropriate for gestational age. Pain/Pressure: Absent     Pelvic:  Cervical exam deferred        Extremities: Normal range of motion.  Edema: None  Mental Status: Normal mood and affect. Normal behavior. Normal judgment and thought content.   Assessment and Plan:  Pregnancy: G1P0 at [redacted]w[redacted]d  1. Nausea and vomiting of pregnancy, antepartum Antiemetics prescribed. - ondansetron (ZOFRAN ODT) 4 MG disintegrating tablet; Take 1 tablet (4 mg total) by mouth every 6 (six) hours as needed for nausea.  Dispense: 20 tablet; Refill: 5 - metoCLOPramide (REGLAN) 10 MG tablet; Take 1 tablet (10 mg total) by mouth 4 (four) times  daily as needed for nausea or vomiting.  Dispense: 30 tablet; Refill: 5  2. Gastroesophageal reflux in pregnancy in third trimester - famotidine (PEPCID) 20 MG tablet; Take 1 tablet (20 mg total) by mouth 2 (two) times daily.  Dispense: 60 tablet; Refill: 5  3. Rh negative, antepartum Rhogam given today. - Antibody screen - rho (d) immune globulin (RHIG/RHOPHYLAC) injection 300 mcg; Inject 2 mLs (300 mcg total) into the muscle once.  4. Need for Tdap vaccination - Tdap vaccine greater than or equal to 7yo IM  5. Flu vaccine need - Flu Vaccine QUAD 36+ mos IM  6. Supervision of other normal pregnancy, antepartum Third trimester labs done today, will follow up results and manage accordingly. - Glucose Tolerance, 2 Hours w/1 Hour - HIV antibody - RPR - CBC Preterm labor symptoms and general obstetric precautions including but not limited to vaginal bleeding, contractions, leaking of fluid and fetal movement were reviewed in detail with the patient. Please refer to After Visit Summary for other counseling recommendations.  Return in about 2 weeks (around 02/12/2017) for OB Visit.   Tereso Newcomer, MD

## 2017-01-29 NOTE — Patient Instructions (Signed)
Return to clinic for any scheduled appointments or obstetric concerns, or go to MAU for evaluation   Third Trimester of Pregnancy The third trimester is from week 28 through week 40 (months 7 through 9). The third trimester is a time when the unborn baby (fetus) is growing rapidly. At the end of the ninth month, the fetus is about 20 inches in length and weighs 6-10 pounds. Body changes during your third trimester Your body will continue to go through many changes during pregnancy. The changes vary from woman to woman. During the third trimester:  Your weight will continue to increase. You can expect to gain 25-35 pounds (11-16 kg) by the end of the pregnancy.  You may begin to get stretch marks on your hips, abdomen, and breasts.  You may urinate more often because the fetus is moving lower into your pelvis and pressing on your bladder.  You may develop or continue to have heartburn. This is caused by increased hormones that slow down muscles in the digestive tract.  You may develop or continue to have constipation because increased hormones slow digestion and cause the muscles that push waste through your intestines to relax.  You may develop hemorrhoids. These are swollen veins (varicose veins) in the rectum that can itch or be painful.  You may develop swollen, bulging veins (varicose veins) in your legs.  You may have increased body aches in the pelvis, back, or thighs. This is due to weight gain and increased hormones that are relaxing your joints.  You may have changes in your hair. These can include thickening of your hair, rapid growth, and changes in texture. Some women also have hair loss during or after pregnancy, or hair that feels dry or thin. Your hair will most likely return to normal after your baby is born.  Your breasts will continue to grow and they will continue to become tender. A yellow fluid (colostrum) may leak from your breasts. This is the first milk you are  producing for your baby.  Your belly button may stick out.  You may notice more swelling in your hands, face, or ankles.  You may have increased tingling or numbness in your hands, arms, and legs. The skin on your belly may also feel numb.  You may feel short of breath because of your expanding uterus.  You may have more problems sleeping. This can be caused by the size of your belly, increased need to urinate, and an increase in your body's metabolism.  You may notice the fetus "dropping," or moving lower in your abdomen (lightening).  You may have increased vaginal discharge.  You may notice your joints feel loose and you may have pain around your pelvic bone.  What to expect at prenatal visits You will have prenatal exams every 2 weeks until week 36. Then you will have weekly prenatal exams. During a routine prenatal visit:  You will be weighed to make sure you and the baby are growing normally.  Your blood pressure will be taken.  Your abdomen will be measured to track your baby's growth.  The fetal heartbeat will be listened to.  Any test results from the previous visit will be discussed.  You may have a cervical check near your due date to see if your cervix has softened or thinned (effaced).  You will be tested for Group B streptococcus. This happens between 35 and 37 weeks.  Your health care provider may ask you:  What your birth plan is.    How you are feeling.  If you are feeling the baby move.  If you have had any abnormal symptoms, such as leaking fluid, bleeding, severe headaches, or abdominal cramping.  If you are using any tobacco products, including cigarettes, chewing tobacco, and electronic cigarettes.  If you have any questions.  Other tests or screenings that may be performed during your third trimester include:  Blood tests that check for low iron levels (anemia).  Fetal testing to check the health, activity level, and growth of the fetus.  Testing is done if you have certain medical conditions or if there are problems during the pregnancy.  Nonstress test (NST). This test checks the health of your baby to make sure there are no signs of problems, such as the baby not getting enough oxygen. During this test, a belt is placed around your belly. The baby is made to move, and its heart rate is monitored during movement.  What is false labor? False labor is a condition in which you feel small, irregular tightenings of the muscles in the womb (contractions) that usually go away with rest, changing position, or drinking water. These are called Braxton Hicks contractions. Contractions may last for hours, days, or even weeks before true labor sets in. If contractions come at regular intervals, become more frequent, increase in intensity, or become painful, you should see your health care provider. What are the signs of labor?  Abdominal cramps.  Regular contractions that start at 10 minutes apart and become stronger and more frequent with time.  Contractions that start on the top of the uterus and spread down to the lower abdomen and back.  Increased pelvic pressure and dull back pain.  A watery or bloody mucus discharge that comes from the vagina.  Leaking of amniotic fluid. This is also known as your "water breaking." It could be a slow trickle or a gush. Let your health care provider know if it has a color or strange odor. If you have any of these signs, call your health care provider right away, even if it is before your due date. Follow these instructions at home: Medicines  Follow your health care provider's instructions regarding medicine use. Specific medicines may be either safe or unsafe to take during pregnancy.  Take a prenatal vitamin that contains at least 600 micrograms (mcg) of folic acid.  If you develop constipation, try taking a stool softener if your health care provider approves. Eating and drinking  Eat a  balanced diet that includes fresh fruits and vegetables, whole grains, good sources of protein such as meat, eggs, or tofu, and low-fat dairy. Your health care provider will help you determine the amount of weight gain that is right for you.  Avoid raw meat and uncooked cheese. These carry germs that can cause birth defects in the baby.  If you have low calcium intake from food, talk to your health care provider about whether you should take a daily calcium supplement.  Eat four or five small meals rather than three large meals a day.  Limit foods that are high in fat and processed sugars, such as fried and sweet foods.  To prevent constipation: ? Drink enough fluid to keep your urine clear or pale yellow. ? Eat foods that are high in fiber, such as fresh fruits and vegetables, whole grains, and beans. Activity  Exercise only as directed by your health care provider. Most women can continue their usual exercise routine during pregnancy. Try to exercise for 30   minutes at least 5 days a week. Stop exercising if you experience uterine contractions.  Avoid heavy lifting.  Do not exercise in extreme heat or humidity, or at high altitudes.  Wear low-heel, comfortable shoes.  Practice good posture.  You may continue to have sex unless your health care provider tells you otherwise. Relieving pain and discomfort  Take frequent breaks and rest with your legs elevated if you have leg cramps or low back pain.  Take warm sitz baths to soothe any pain or discomfort caused by hemorrhoids. Use hemorrhoid cream if your health care provider approves.  Wear a good support bra to prevent discomfort from breast tenderness.  If you develop varicose veins: ? Wear support pantyhose or compression stockings as told by your healthcare provider. ? Elevate your feet for 15 minutes, 3-4 times a day. Prenatal care  Write down your questions. Take them to your prenatal visits.  Keep all your prenatal  visits as told by your health care provider. This is important. Safety  Wear your seat belt at all times when driving.  Make a list of emergency phone numbers, including numbers for family, friends, the hospital, and police and fire departments. General instructions  Avoid cat litter boxes and soil used by cats. These carry germs that can cause birth defects in the baby. If you have a cat, ask someone to clean the litter box for you.  Do not travel far distances unless it is absolutely necessary and only with the approval of your health care provider.  Do not use hot tubs, steam rooms, or saunas.  Do not drink alcohol.  Do not use any products that contain nicotine or tobacco, such as cigarettes and e-cigarettes. If you need help quitting, ask your health care provider.  Do not use any medicinal herbs or unprescribed drugs. These chemicals affect the formation and growth of the baby.  Do not douche or use tampons or scented sanitary pads.  Do not cross your legs for long periods of time.  To prepare for the arrival of your baby: ? Take prenatal classes to understand, practice, and ask questions about labor and delivery. ? Make a trial run to the hospital. ? Visit the hospital and tour the maternity area. ? Arrange for maternity or paternity leave through employers. ? Arrange for family and friends to take care of pets while you are in the hospital. ? Purchase a rear-facing car seat and make sure you know how to install it in your car. ? Pack your hospital bag. ? Prepare the baby's nursery. Make sure to remove all pillows and stuffed animals from the baby's crib to prevent suffocation.  Visit your dentist if you have not gone during your pregnancy. Use a soft toothbrush to brush your teeth and be gentle when you floss. Contact a health care provider if:  You are unsure if you are in labor or if your water has broken.  You become dizzy.  You have mild pelvic cramps, pelvic  pressure, or nagging pain in your abdominal area.  You have lower back pain.  You have persistent nausea, vomiting, or diarrhea.  You have an unusual or bad smelling vaginal discharge.  You have pain when you urinate. Get help right away if:  Your water breaks before 37 weeks.  You have regular contractions less than 5 minutes apart before 37 weeks.  You have a fever.  You are leaking fluid from your vagina.  You have spotting or bleeding from your vagina.    You have severe abdominal pain or cramping.  You have rapid weight loss or weight gain.  You have shortness of breath with chest pain.  You notice sudden or extreme swelling of your face, hands, ankles, feet, or legs.  Your baby makes fewer than 10 movements in 2 hours.  You have severe headaches that do not go away when you take medicine.  You have vision changes. Summary  The third trimester is from week 28 through week 40, months 7 through 9. The third trimester is a time when the unborn baby (fetus) is growing rapidly.  During the third trimester, your discomfort may increase as you and your baby continue to gain weight. You may have abdominal, leg, and back pain, sleeping problems, and an increased need to urinate.  During the third trimester your breasts will keep growing and they will continue to become tender. A yellow fluid (colostrum) may leak from your breasts. This is the first milk you are producing for your baby.  False labor is a condition in which you feel small, irregular tightenings of the muscles in the womb (contractions) that eventually go away. These are called Braxton Hicks contractions. Contractions may last for hours, days, or even weeks before true labor sets in.  Signs of labor can include: abdominal cramps; regular contractions that start at 10 minutes apart and become stronger and more frequent with time; watery or bloody mucus discharge that comes from the vagina; increased pelvic pressure  and dull back pain; and leaking of amniotic fluid. This information is not intended to replace advice given to you by your health care provider. Make sure you discuss any questions you have with your health care provider. Document Released: 10/10/2001 Document Revised: 03/23/2016 Document Reviewed: 12/17/2012 Elsevier Interactive Patient Education  2017 [employer]  

## 2017-01-29 NOTE — Progress Notes (Signed)
Pt presents for ROB, 2 hr gtt, ABx screen, Rhogam, flu vaccine and Tdap. Pt states morning N&V has returned.

## 2017-01-30 LAB — GLUCOSE TOLERANCE, 2 HOURS W/ 1HR
GLUCOSE, 2 HOUR: 60 mg/dL — AB (ref 65–152)
GLUCOSE, FASTING: 81 mg/dL (ref 65–91)
Glucose, 1 hour: 86 mg/dL (ref 65–179)

## 2017-01-30 LAB — CBC
Hematocrit: 32.6 % — ABNORMAL LOW (ref 34.0–46.6)
Hemoglobin: 11 g/dL — ABNORMAL LOW (ref 11.1–15.9)
MCH: 30.3 pg (ref 26.6–33.0)
MCHC: 33.7 g/dL (ref 31.5–35.7)
MCV: 90 fL (ref 79–97)
PLATELETS: 282 10*3/uL (ref 150–379)
RBC: 3.63 x10E6/uL — AB (ref 3.77–5.28)
RDW: 14.3 % (ref 12.3–15.4)
WBC: 6.1 10*3/uL (ref 3.4–10.8)

## 2017-01-30 LAB — ANTIBODY SCREEN: ANTIBODY SCREEN: NEGATIVE

## 2017-01-30 LAB — HIV ANTIBODY (ROUTINE TESTING W REFLEX): HIV SCREEN 4TH GENERATION: NONREACTIVE

## 2017-01-30 LAB — RPR: RPR Ser Ql: NONREACTIVE

## 2017-02-13 ENCOUNTER — Ambulatory Visit (INDEPENDENT_AMBULATORY_CARE_PROVIDER_SITE_OTHER): Payer: Medicaid Other | Admitting: Obstetrics and Gynecology

## 2017-02-13 VITALS — BP 105/74 | HR 71 | Wt 134.0 lb

## 2017-02-13 DIAGNOSIS — Z3403 Encounter for supervision of normal first pregnancy, third trimester: Secondary | ICD-10-CM

## 2017-02-13 DIAGNOSIS — Z6791 Unspecified blood type, Rh negative: Secondary | ICD-10-CM

## 2017-02-13 DIAGNOSIS — O26899 Other specified pregnancy related conditions, unspecified trimester: Secondary | ICD-10-CM

## 2017-02-13 DIAGNOSIS — O360930 Maternal care for other rhesus isoimmunization, third trimester, not applicable or unspecified: Secondary | ICD-10-CM

## 2017-02-13 DIAGNOSIS — Z3400 Encounter for supervision of normal first pregnancy, unspecified trimester: Secondary | ICD-10-CM

## 2017-02-13 NOTE — Progress Notes (Signed)
Subjective:  Denise [name] is a 26 y.o. G1P0 at [redacted]w[redacted]d being seen today for ongoing prenatal care.  She is currently monitored for the following issues for this low-risk pregnancy and has ADHD (attention deficit hyperactivity disorder); Pityriasis rosea; Insomnia; Supervision of normal pregnancy, antepartum; and Rh negative, antepartum on her problem list.  Patient reports no complaints.  Contractions: Not present. Vag. Bleeding: None.  Movement: Present. Denies leaking of fluid.   The following portions of the patient's history were reviewed and updated as appropriate: allergies, current medications, past family history, past medical history, past social history, past surgical history and problem list. Problem list updated.  Objective:   Vitals:   02/13/17 0950  BP: 105/74  Pulse: 71  Weight: 134 lb (60.8 kg)    Fetal Status: Fetal Heart Rate (bpm): 140   Movement: Present     General:  Alert, oriented and cooperative. Patient is in no acute distress.  Skin: Skin is warm and dry. No rash noted.   Cardiovascular: Normal heart rate noted  Respiratory: Normal respiratory effort, no problems with respiration noted  Abdomen: Soft, gravid, appropriate for gestational age. Pain/Pressure: Absent     Pelvic:  Cervical exam deferred        Extremities: Normal range of motion.  Edema: None  Mental Status: Normal mood and affect. Normal behavior. Normal judgment and thought content.   Urinalysis:      Assessment and Plan:  Pregnancy: G1P0 at [redacted]w[redacted]d  1. Supervision of normal first pregnancy, antepartum Stable N/V from last visit has resolved  2. Rh negative, antepartum Rhogam today  Preterm labor symptoms and general obstetric precautions including but not limited to vaginal bleeding, contractions, leaking of fluid and fetal movement were reviewed in detail with the patient. Please refer to After Visit Summary for other counseling recommendations.  Return in about 2 weeks (around  02/27/2017) for OB visit.   Hermina Staggers, MD

## 2017-02-27 ENCOUNTER — Ambulatory Visit (INDEPENDENT_AMBULATORY_CARE_PROVIDER_SITE_OTHER): Payer: Medicaid Other | Admitting: Obstetrics and Gynecology

## 2017-02-27 VITALS — BP 109/75 | HR 80 | Wt 137.9 lb

## 2017-02-27 DIAGNOSIS — Z3483 Encounter for supervision of other normal pregnancy, third trimester: Secondary | ICD-10-CM

## 2017-02-27 DIAGNOSIS — O26899 Other specified pregnancy related conditions, unspecified trimester: Principal | ICD-10-CM

## 2017-02-27 DIAGNOSIS — Z6791 Unspecified blood type, Rh negative: Secondary | ICD-10-CM

## 2017-02-27 DIAGNOSIS — Z3480 Encounter for supervision of other normal pregnancy, unspecified trimester: Secondary | ICD-10-CM

## 2017-02-27 NOTE — Progress Notes (Signed)
Subjective:  Denise [name] is a 26 y.o. G1P0 at 102w4d being seen today for ongoing prenatal care.  She is currently monitored for the following issues for this low-risk pregnancy and has ADHD (attention deficit hyperactivity disorder); Pityriasis rosea; Insomnia; Supervision of normal pregnancy, antepartum; and Rh negative, antepartum on her problem list.  Patient reports no complaints.  Contractions: Not present. Vag. Bleeding: None.  Movement: Present. Denies leaking of fluid.   The following portions of the patient's history were reviewed and updated as appropriate: allergies, current medications, past family history, past medical history, past social history, past surgical history and problem list. Problem list updated.  Objective:   Vitals:   02/27/17 1304  BP: 109/75  Pulse: 80  Weight: 62.6 kg (137 lb 14.4 oz)    Fetal Status: Fetal Heart Rate (bpm): 128   Movement: Present     General:  Alert, oriented and cooperative. Patient is in no acute distress.  Skin: Skin is warm and dry. No rash noted.   Cardiovascular: Normal heart rate noted  Respiratory: Normal respiratory effort, no problems with respiration noted  Abdomen: Soft, gravid, appropriate for gestational age. Pain/Pressure: Absent     Pelvic:  Cervical exam deferred        Extremities: Normal range of motion.  Edema: Trace  Mental Status: Normal mood and affect. Normal behavior. Normal judgment and thought content.   Urinalysis:      Assessment and Plan:  Pregnancy: G1P0 at [redacted]w[redacted]d  1. Rh negative, antepartum S/P Rhogam  2. Supervision of other normal pregnancy, antepartum Stable  Preterm labor symptoms and general obstetric precautions including but not limited to vaginal bleeding, contractions, leaking of fluid and fetal movement were reviewed in detail with the patient. Please refer to After Visit Summary for other counseling recommendations.  Return in about 2 weeks (around 03/13/2017) for OB  visit.   Hermina Staggers, MD

## 2017-03-13 ENCOUNTER — Ambulatory Visit (INDEPENDENT_AMBULATORY_CARE_PROVIDER_SITE_OTHER): Payer: Medicaid Other | Admitting: Obstetrics & Gynecology

## 2017-03-13 VITALS — BP 121/78 | HR 80 | Wt 138.8 lb

## 2017-03-13 DIAGNOSIS — Z3400 Encounter for supervision of normal first pregnancy, unspecified trimester: Secondary | ICD-10-CM

## 2017-03-13 DIAGNOSIS — Z3403 Encounter for supervision of normal first pregnancy, third trimester: Secondary | ICD-10-CM

## 2017-03-13 NOTE — Progress Notes (Signed)
   PRENATAL VISIT NOTE  Subjective:  Denise [name] is a 26 y.o. G1P0 at 6659w4d being seen today for ongoing prenatal care.  She is currently monitored for the following issues for this low-risk pregnancy and has ADHD (attention deficit hyperactivity disorder); Pityriasis rosea; Insomnia; Supervision of normal pregnancy, antepartum; and Rh negative, antepartum on her problem list.  Patient reports no complaints.  Contractions: Not present. Vag. Bleeding: None.  Movement: Present. Denies leaking of fluid.   The following portions of the patient's history were reviewed and updated as appropriate: allergies, current medications, past family history, past medical history, past social history, past surgical history and problem list. Problem list updated.  Objective:   Vitals:   03/13/17 0943  BP: 121/78  Pulse: 80  Weight: 63 kg (138 lb 12.8 oz)    Fetal Status: Fetal Heart Rate (bpm): 130 Fundal Height: 34 cm Movement: Present     General:  Alert, oriented and cooperative. Patient is in no acute distress.  Skin: Skin is warm and dry. No rash noted.   Cardiovascular: Normal heart rate noted  Respiratory: Normal respiratory effort, no problems with respiration noted  Abdomen: Soft, gravid, appropriate for gestational age. Pain/Pressure: Absent     Pelvic:  Cervical exam deferred        Extremities: Normal range of motion.  Edema: None  Mental Status: Normal mood and affect. Normal behavior. Normal judgment and thought content.   Assessment and Plan:  Pregnancy: G1P0 at 5159w4d  1. Supervision of normal first pregnancy, antepartum Doing well  Preterm labor symptoms and general obstetric precautions including but not limited to vaginal bleeding, contractions, leaking of fluid and fetal movement were reviewed in detail with the patient. Please refer to After Visit Summary for other counseling recommendations.  Return in about 1 week (around 03/20/2017).   Adam PhenixArnold, Sabastion Hrdlicka G, MD

## 2017-03-13 NOTE — Patient Instructions (Signed)

## 2017-03-13 NOTE — Progress Notes (Signed)
Patient reports no concerns.

## 2017-03-21 ENCOUNTER — Other Ambulatory Visit (HOSPITAL_COMMUNITY)
Admission: RE | Admit: 2017-03-21 | Discharge: 2017-03-21 | Disposition: A | Discharge status: Home / Self Care | Payer: Medicaid Other | Source: Ambulatory Visit | Attending: Obstetrics and Gynecology | Admitting: Obstetrics and Gynecology

## 2017-03-21 ENCOUNTER — Ambulatory Visit (INDEPENDENT_AMBULATORY_CARE_PROVIDER_SITE_OTHER): Payer: Medicaid Other | Admitting: Obstetrics and Gynecology

## 2017-03-21 VITALS — BP 125/78 | HR 93 | Wt 140.0 lb

## 2017-03-21 DIAGNOSIS — Z3492 Encounter for supervision of normal pregnancy, unspecified, second trimester: Secondary | ICD-10-CM

## 2017-03-21 DIAGNOSIS — Z6791 Unspecified blood type, Rh negative: Secondary | ICD-10-CM

## 2017-03-21 DIAGNOSIS — O26899 Other specified pregnancy related conditions, unspecified trimester: Secondary | ICD-10-CM

## 2017-03-21 DIAGNOSIS — Z3403 Encounter for supervision of normal first pregnancy, third trimester: Secondary | ICD-10-CM | POA: Diagnosis not present

## 2017-03-21 DIAGNOSIS — Z3A35 35 weeks gestation of pregnancy: Secondary | ICD-10-CM | POA: Insufficient documentation

## 2017-03-21 DIAGNOSIS — Z3400 Encounter for supervision of normal first pregnancy, unspecified trimester: Secondary | ICD-10-CM

## 2017-03-21 LAB — OB RESULTS CONSOLE GBS: GBS: POSITIVE

## 2017-03-21 NOTE — Progress Notes (Signed)
Subjective:  Denise [name] is a 26 y.o. G1P0 at 7422w5d being seen today for ongoing prenatal care.  She is currently monitored for the following issues for this low-risk pregnancy and has ADHD (attention deficit hyperactivity disorder); Pityriasis rosea; Insomnia; Supervision of normal pregnancy, antepartum; and Rh negative, antepartum on her problem list.  Patient reports no complaints.  Contractions: Not present. Vag. Bleeding: None.  Movement: Present. Denies leaking of fluid.   The following portions of the patient's history were reviewed and updated as appropriate: allergies, current medications, past family history, past medical history, past social history, past surgical history and problem list. Problem list updated.  Objective:   Vitals:   03/21/17 1611  BP: 125/78  Pulse: 93  Weight: 140 lb (63.5 kg)    Fetal Status: Fetal Heart Rate (bpm): 145   Movement: Present     General:  Alert, oriented and cooperative. Patient is in no acute distress.  Skin: Skin is warm and dry. No rash noted.   Cardiovascular: Normal heart rate noted  Respiratory: Normal respiratory effort, no problems with respiration noted  Abdomen: Soft, gravid, appropriate for gestational age. Pain/Pressure: Present     Pelvic:  Cervical exam performed        Extremities: Normal range of motion.  Edema: Trace  Mental Status: Normal mood and affect. Normal behavior. Normal judgment and thought content.   Urinalysis:      Assessment and Plan:  Pregnancy: G1P0 at 8722w5d  1. Supervision of normal first pregnancy, antepartum  - Strep Gp B NAA - Cervicovaginal ancillary only  2. Rh negative, antepartum S/P Rhogam  Preterm labor symptoms and general obstetric precautions including but not limited to vaginal bleeding, contractions, leaking of fluid and fetal movement were reviewed in detail with the patient. Please refer to After Visit Summary for other counseling recommendations.  Return in about 1 week  (around 03/28/2017) for OB visit.   Hermina StaggersErvin, Revia Nghiem L, MD

## 2017-03-22 LAB — CERVICOVAGINAL ANCILLARY ONLY
CHLAMYDIA, DNA PROBE: NEGATIVE
NEISSERIA GONORRHEA: NEGATIVE

## 2017-03-27 LAB — STREP GP B SUSCEPTIBILITY

## 2017-03-27 LAB — STREP GP B CULTURE+RFLX: Strep Gp B Culture+Rflx: POSITIVE — AB

## 2017-03-29 ENCOUNTER — Ambulatory Visit (INDEPENDENT_AMBULATORY_CARE_PROVIDER_SITE_OTHER): Payer: Medicaid Other | Admitting: Obstetrics and Gynecology

## 2017-03-29 VITALS — BP 135/84 | HR 76 | Wt 139.0 lb

## 2017-03-29 DIAGNOSIS — O99820 Streptococcus B carrier state complicating pregnancy: Secondary | ICD-10-CM

## 2017-03-29 DIAGNOSIS — Z3403 Encounter for supervision of normal first pregnancy, third trimester: Secondary | ICD-10-CM

## 2017-03-29 DIAGNOSIS — Z3400 Encounter for supervision of normal first pregnancy, unspecified trimester: Secondary | ICD-10-CM

## 2017-03-29 DIAGNOSIS — Z8619 Personal history of other infectious and parasitic diseases: Secondary | ICD-10-CM

## 2017-03-29 DIAGNOSIS — O09893 Supervision of other high risk pregnancies, third trimester: Secondary | ICD-10-CM

## 2017-03-29 DIAGNOSIS — Z6791 Unspecified blood type, Rh negative: Secondary | ICD-10-CM

## 2017-03-29 DIAGNOSIS — O26899 Other specified pregnancy related conditions, unspecified trimester: Principal | ICD-10-CM

## 2017-03-29 MED ORDER — VALACYCLOVIR HCL 500 MG PO TABS: 500.0000 mg | ORAL_TABLET | Freq: Two times a day (BID) | ORAL | 6 refills | Status: DC

## 2017-03-29 NOTE — Progress Notes (Signed)
   PRENATAL VISIT NOTE  Subjective:  Denise [name] is a 26 y.o. G1P0 at 3331w6d being seen today for ongoing prenatal care.  She is currently monitored for the following issues for this low-risk pregnancy and has ADHD (attention deficit hyperactivity disorder); Pityriasis rosea; Insomnia; Supervision of normal pregnancy, antepartum; Rh negative, antepartum; and GBS (group B Streptococcus carrier), +RV culture, currently pregnant on her problem list.  Patient reports no complaints.  Contractions: Irregular. Vag. Bleeding: None.  Movement: Present. Denies leaking of fluid.   The following portions of the patient's history were reviewed and updated as appropriate: allergies, current medications, past family history, past medical history, past social history, past surgical history and problem list. Problem list updated.  Objective:   Vitals:   03/29/17 0920  BP: 135/84  Pulse: 76  Weight: 139 lb (63 kg)    Fetal Status: Fetal Heart Rate (bpm): 135 Fundal Height: 35 cm Movement: Present     General:  Alert, oriented and cooperative. Patient is in no acute distress.  Skin: Skin is warm and dry. No rash noted.   Cardiovascular: Normal heart rate noted  Respiratory: Normal respiratory effort, no problems with respiration noted  Abdomen: Soft, gravid, appropriate for gestational age. Pain/Pressure: Present     Pelvic:  Cervical exam deferred        Extremities: Normal range of motion.  Edema: Trace  Mental Status: Normal mood and affect. Normal behavior. Normal judgment and thought content.   Assessment and Plan:  Pregnancy: G1P0 at 2031w6d  1. Supervision of normal first pregnancy, antepartum Patient is doing well without complaints Patient with history of herpes- Rx for Valtrex provided  2. Rh negative, antepartum S/p rhogam  3. GBS (group B Streptococcus carrier), +RV culture, currently pregnant Patient informed of carrier status Will provide prophylaxis in labor  Preterm labor  symptoms and general obstetric precautions including but not limited to vaginal bleeding, contractions, leaking of fluid and fetal movement were reviewed in detail with the patient. Please refer to After Visit Summary for other counseling recommendations.  Return in about 1 week (around 04/05/2017) for ROB.   Catalina AntiguaPeggy Dez Stauffer, MD

## 2017-04-06 ENCOUNTER — Ambulatory Visit (INDEPENDENT_AMBULATORY_CARE_PROVIDER_SITE_OTHER): Payer: Medicaid Other | Admitting: Certified Nurse Midwife

## 2017-04-06 ENCOUNTER — Telehealth: Payer: Self-pay

## 2017-04-06 VITALS — BP 123/78 | HR 68 | Wt 141.5 lb

## 2017-04-06 DIAGNOSIS — Z3400 Encounter for supervision of normal first pregnancy, unspecified trimester: Secondary | ICD-10-CM

## 2017-04-06 DIAGNOSIS — O26899 Other specified pregnancy related conditions, unspecified trimester: Secondary | ICD-10-CM

## 2017-04-06 DIAGNOSIS — Z3403 Encounter for supervision of normal first pregnancy, third trimester: Secondary | ICD-10-CM

## 2017-04-06 DIAGNOSIS — O09893 Supervision of other high risk pregnancies, third trimester: Secondary | ICD-10-CM

## 2017-04-06 DIAGNOSIS — O99820 Streptococcus B carrier state complicating pregnancy: Secondary | ICD-10-CM

## 2017-04-06 DIAGNOSIS — Z6791 Unspecified blood type, Rh negative: Secondary | ICD-10-CM

## 2017-04-06 DIAGNOSIS — Z8619 Personal history of other infectious and parasitic diseases: Secondary | ICD-10-CM

## 2017-04-06 NOTE — Progress Notes (Signed)
   PRENATAL VISIT NOTE  Subjective:  Denise [name] is a 26 y.o. G1P0 at 3887w0d being seen today for ongoing prenatal care.  She is currently monitored for the following issues for this low-risk pregnancy and has ADHD (attention deficit hyperactivity disorder); Pityriasis rosea; Insomnia; Supervision of normal pregnancy, antepartum; Rh negative, antepartum; GBS (group B Streptococcus carrier), +RV culture, currently pregnant; and History of herpes genitalis on her problem list.  Patient reports no complaints.  Contractions: Not present. Vag. Bleeding: None.  Movement: Present. Denies leaking of fluid.   The following portions of the patient's history were reviewed and updated as appropriate: allergies, current medications, past family history, past medical history, past social history, past surgical history and problem list. Problem list updated.  Objective:   Vitals:   04/06/17 0817  BP: 123/78  Pulse: 68  Weight: 141 lb 8 oz (64.2 kg)    Fetal Status: Fetal Heart Rate (bpm): 126 Fundal Height: 35 cm Movement: Present  Presentation: Vertex  General:  Alert, oriented and cooperative. Patient is in no acute distress.  Skin: Skin is warm and dry. No rash noted.   Cardiovascular: Normal heart rate noted  Respiratory: Normal respiratory effort, no problems with respiration noted  Abdomen: Soft, gravid, appropriate for gestational age. Pain/Pressure: Absent     Pelvic:  Cervical exam performed Dilation: 1 Effacement (%): Thick Station: -3  Extremities: Normal range of motion.  Edema: Trace  Mental Status: Normal mood and affect. Normal behavior. Normal judgment and thought content.   Assessment and Plan:  Pregnancy: G1P0 at 7287w0d  1. Supervision of normal first pregnancy, antepartum     Doing well  2. Rh negative, antepartum      Rhogam given 01/29/17  3. GBS (group B Streptococcus carrier), +RV culture, currently pregnant      PCN for labor/delivery  4. History of herpes  genitalis      Taking Valtrex, no active lesions.   Term labor symptoms and general obstetric precautions including but not limited to vaginal bleeding, contractions, leaking of fluid and fetal movement were reviewed in detail with the patient. Please refer to After Visit Summary for other counseling recommendations.  Return in about 1 week (around 04/13/2017) for ROB.   Roe Coombsachelle A Gerhart Ruggieri, CNM

## 2017-04-06 NOTE — Progress Notes (Signed)
Patient reports good fetal movement, denies contractions. 

## 2017-04-06 NOTE — Telephone Encounter (Signed)
Left mess on VM that we returned her call and that the offices closes at 12:pm today.  She did not state a reason for her call to us.

## 2017-04-11 ENCOUNTER — Ambulatory Visit (INDEPENDENT_AMBULATORY_CARE_PROVIDER_SITE_OTHER): Payer: Medicaid Other | Admitting: Obstetrics & Gynecology

## 2017-04-11 VITALS — BP 128/83 | HR 76 | Wt 138.0 lb

## 2017-04-11 DIAGNOSIS — Z3403 Encounter for supervision of normal first pregnancy, third trimester: Secondary | ICD-10-CM

## 2017-04-11 DIAGNOSIS — Z3400 Encounter for supervision of normal first pregnancy, unspecified trimester: Secondary | ICD-10-CM

## 2017-04-11 NOTE — Progress Notes (Signed)
   PRENATAL VISIT NOTE  Subjective:  Denise [name] is a 26 y.o. G1P0 at 1736w5d being seen today for ongoing prenatal care.  She is currently monitored for the following issues for this low-risk pregnancy and has ADHD (attention deficit hyperactivity disorder); Pityriasis rosea; Insomnia; Supervision of normal pregnancy, antepartum; Rh negative, antepartum; GBS (group B Streptococcus carrier), +RV culture, currently pregnant; and History of herpes genitalis on her problem list.  Patient reports no complaints.  Contractions: Irregular. Vag. Bleeding: None.  Movement: Present. Denies leaking of fluid.   The following portions of the patient's history were reviewed and updated as appropriate: allergies, current medications, past family history, past medical history, past social history, past surgical history and problem list. Problem list updated.  Objective:   Vitals:   04/11/17 1346  BP: 128/83  Pulse: 76  Weight: 138 lb (62.6 kg)    Fetal Status: Fetal Heart Rate (bpm): 150 Fundal Height: 35 cm Movement: Present  Presentation: Vertex  General:  Alert, oriented and cooperative. Patient is in no acute distress.  Skin: Skin is warm and dry. No rash noted.   Cardiovascular: Normal heart rate noted  Respiratory: Normal respiratory effort, no problems with respiration noted  Abdomen: Soft, gravid, appropriate for gestational age. Pain/Pressure: Present     Pelvic:  Cervical exam performed Dilation: 1.5 Effacement (%): 60 Station: -3  Extremities: Normal range of motion.  Edema: Trace  Mental Status: Normal mood and affect. Normal behavior. Normal judgment and thought content.   Assessment and Plan:  Pregnancy: G1P0 at 5436w5d  1. Supervision of normal first pregnancy, antepartum   Term labor symptoms and general obstetric precautions including but not limited to vaginal bleeding, contractions, leaking of fluid and fetal movement were reviewed in detail with the patient. Please refer to  After Visit Summary for other counseling recommendations.  Return in about 1 week (around 04/18/2017).   Scheryl DarterJames Nairi Oswald, MD

## 2017-04-11 NOTE — Patient Instructions (Signed)
Vaginal Delivery Vaginal delivery means that you will give birth by pushing your baby out of your birth canal (vagina). A team of health care providers will help you before, during, and after vaginal delivery. Birth experiences are unique for every woman and every pregnancy, and birth experiences vary depending on where you choose to give birth. What should I do to prepare for my baby's birth? Before your baby is born, it is important to talk with your health care provider about:  Your labor and delivery preferences. These may include: ? Medicines that you may be given. ? How you will manage your pain. This might include non-medical pain relief techniques or injectable pain relief such as epidural analgesia. ? How you and your baby will be monitored during labor and delivery. ? Who may be in the labor and delivery room with you. ? Your feelings about surgical delivery of your baby (cesarean delivery, or C-section) if this becomes necessary. ? Your feelings about receiving donated blood through an IV tube (blood transfusion) if this becomes necessary.  Whether you are able: ? To take pictures or videos of the birth. ? To eat during labor and delivery. ? To move around, walk, or change positions during labor and delivery.  What to expect after your baby is born, such as: ? Whether delayed umbilical cord clamping and cutting is offered. ? Who will care for your baby right after birth. ? Medicines or tests that may be recommended for your baby. ? Whether breastfeeding is supported in your hospital or birth center. ? How long you will be in the hospital or birth center.  How any medical conditions you have may affect your baby or your labor and delivery experience.  To prepare for your baby's birth, you should also:  Attend all of your health care visits before delivery (prenatal visits) as recommended by your health care provider. This is important.  Prepare your home for your baby's  arrival. Make sure that you have: ? Diapers. ? Baby clothing. ? Feeding equipment. ? Safe sleeping arrangements for you and your baby.  Install a car seat in your vehicle. Have your car seat checked by a certified car seat installer to make sure that it is installed safely.  Think about who will help you with your new baby at home for at least the first several weeks after delivery.  What can I expect when I arrive at the birth center or hospital? Once you are in labor and have been admitted into the hospital or birth center, your health care provider may:  Review your pregnancy history and any concerns you have.  Insert an IV tube into one of your veins. This is used to give you fluids and medicines.  Check your blood pressure, pulse, temperature, and heart rate (vital signs).  Check whether your bag of water (amniotic sac) has broken (ruptured).  Talk with you about your birth plan and discuss pain control options.  Monitoring Your health care provider may monitor your contractions (uterine monitoring) and your baby's heart rate (fetal monitoring). You may need to be monitored:  Often, but not continuously (intermittently).  All the time or for long periods at a time (continuously). Continuous monitoring may be needed if: ? You are taking certain medicines, such as medicine to relieve pain or make your contractions stronger. ? You have pregnancy or labor complications.  Monitoring may be done by:  Placing a special stethoscope or a handheld monitoring device on your abdomen to   check your baby's heartbeat, and feeling your abdomen for contractions. This method of monitoring does not continuously record your baby's heartbeat or your contractions.  Placing monitors on your abdomen (external monitors) to record your baby's heartbeat and the frequency and length of contractions. You may not have to wear external monitors all the time.  Placing monitors inside of your uterus  (internal monitors) to record your baby's heartbeat and the frequency, length, and strength of your contractions. ? Your health care provider may use internal monitors if he or she needs more information about the strength of your contractions or your baby's heart rate. ? Internal monitors are put in place by passing a thin, flexible wire through your vagina and into your uterus. Depending on the type of monitor, it may remain in your uterus or on your baby's head until birth. ? Your health care provider will discuss the benefits and risks of internal monitoring with you and will ask for your permission before inserting the monitors.  Telemetry. This is a type of continuous monitoring that can be done with external or internal monitors. Instead of having to stay in bed, you are able to move around during telemetry. Ask your health care provider if telemetry is an option for you.  Physical exam Your health care provider may perform a physical exam. This may include:  Checking whether your baby is positioned: ? With the head toward your vagina (head-down). This is most common. ? With the head toward the top of your uterus (head-up or breech). If your baby is in a breech position, your health care provider may try to turn your baby to a head-down position so you can deliver vaginally. If it does not seem that your baby can be born vaginally, your provider may recommend surgery to deliver your baby. In rare cases, you may be able to deliver vaginally if your baby is head-up (breech delivery). ? Lying sideways (transverse). Babies that are lying sideways cannot be delivered vaginally.  Checking your cervix to determine: ? Whether it is thinning out (effacing). ? Whether it is opening up (dilating). ? How low your baby has moved into your birth canal.  What are the three stages of labor and delivery?  Normal labor and delivery is divided into the following three stages: Stage 1  Stage 1 is the  longest stage of labor, and it can last for hours or days. Stage 1 includes: ? Early labor. This is when contractions may be irregular, or regular and mild. Generally, early labor contractions are more than 10 minutes apart. ? Active labor. This is when contractions get longer, more regular, more frequent, and more intense. ? The transition phase. This is when contractions happen very close together, are very intense, and may last longer than during any other part of labor.  Contractions generally feel mild, infrequent, and irregular at first. They get stronger, more frequent (about every 2-3 minutes), and more regular as you progress from early labor through active labor and transition.  Many women progress through stage 1 naturally, but you may need help to continue making progress. If this happens, your health care provider may talk with you about: ? Rupturing your amniotic sac if it has not ruptured yet. ? Giving you medicine to help make your contractions stronger and more frequent.  Stage 1 ends when your cervix is completely dilated to 4 inches (10 cm) and completely effaced. This happens at the end of the transition phase. Stage 2  Once   your cervix is completely effaced and dilated to 4 inches (10 cm), you may start to feel an urge to push. It is common for the body to naturally take a rest before feeling the urge to push, especially if you received an epidural or certain other pain medicines. This rest period may last for up to 1-2 hours, depending on your unique labor experience.  During stage 2, contractions are generally less painful, because pushing helps relieve contraction pain. Instead of contraction pain, you may feel stretching and burning pain, especially when the widest part of your baby's head passes through the vaginal opening (crowning).  Your health care provider will closely monitor your pushing progress and your baby's progress through the vagina during stage 2.  Your  health care provider may massage the area of skin between your vaginal opening and anus (perineum) or apply warm compresses to your perineum. This helps it stretch as the baby's head starts to crown, which can help prevent perineal tearing. ? In some cases, an incision may be made in your perineum (episiotomy) to allow the baby to pass through the vaginal opening. An episiotomy helps to make the opening of the vagina larger to allow more room for the baby to fit through.  It is very important to breathe and focus so your health care provider can control the delivery of your baby's head. Your health care provider may have you decrease the intensity of your pushing, to help prevent perineal tearing.  After delivery of your baby's head, the shoulders and the rest of the body generally deliver very quickly and without difficulty.  Once your baby is delivered, the umbilical cord may be cut right away, or this may be delayed for 1-2 minutes, depending on your baby's health. This may vary among health care providers, hospitals, and birth centers.  If you and your baby are healthy enough, your baby may be placed on your chest or abdomen to help maintain the baby's temperature and to help you bond with each other. Some mothers and babies start breastfeeding at this time. Your health care team will dry your baby and help keep your baby warm during this time.  Your baby may need immediate care if he or she: ? Showed signs of distress during labor. ? Has a medical condition. ? Was born too early (prematurely). ? Had a bowel movement before birth (meconium). ? Shows signs of difficulty transitioning from being inside the uterus to being outside of the uterus. If you are planning to breastfeed, your health care team will help you begin a feeding. Stage 3  The third stage of labor starts immediately after the birth of your baby and ends after you deliver the placenta. The placenta is an organ that develops  during pregnancy to provide oxygen and nutrients to your baby in the womb.  Delivering the placenta may require some pushing, and you may have mild contractions. Breastfeeding can stimulate contractions to help you deliver the placenta.  After the placenta is delivered, your uterus should tighten (contract) and become firm. This helps to stop bleeding in your uterus. To help your uterus contract and to control bleeding, your health care provider may: ? Give you medicine by injection, through an IV tube, by mouth, or through your rectum (rectally). ? Massage your abdomen or perform a vaginal exam to remove any blood clots that are left in your uterus. ? Empty your bladder by placing a thin, flexible tube (catheter) into your bladder. ? Encourage   you to breastfeed your baby. After labor is over, you and your baby will be monitored closely to ensure that you are both healthy until you are ready to go home. Your health care team will teach you how to care for yourself and your baby. This information is not intended to replace advice given to you by your health care provider. Make sure you discuss any questions you have with your health care provider. Document Released: 07/25/2008 Document Revised: 05/05/2016 Document Reviewed: 10/31/2015 Elsevier Interactive Patient Education  2018 [employer]  

## 2017-04-11 NOTE — Progress Notes (Signed)
Pt requests cx check

## 2017-04-17 ENCOUNTER — Inpatient Hospital Stay (HOSPITAL_COMMUNITY)
Admission: AD | Admit: 2017-04-17 | Discharge: 2017-04-20 | DRG: 774 | Disposition: A | Discharge status: Home / Self Care | Payer: Medicaid Other | Source: Ambulatory Visit | Attending: Obstetrics and Gynecology | Admitting: Obstetrics and Gynecology

## 2017-04-17 ENCOUNTER — Inpatient Hospital Stay (HOSPITAL_COMMUNITY): Payer: Medicaid Other | Admitting: Anesthesiology

## 2017-04-17 ENCOUNTER — Encounter (HOSPITAL_COMMUNITY): Payer: Self-pay

## 2017-04-17 DIAGNOSIS — O99344 Other mental disorders complicating childbirth: Secondary | ICD-10-CM | POA: Diagnosis present

## 2017-04-17 DIAGNOSIS — O26893 Other specified pregnancy related conditions, third trimester: Secondary | ICD-10-CM | POA: Diagnosis present

## 2017-04-17 DIAGNOSIS — O99824 Streptococcus B carrier state complicating childbirth: Secondary | ICD-10-CM | POA: Diagnosis present

## 2017-04-17 DIAGNOSIS — O9832 Other infections with a predominantly sexual mode of transmission complicating childbirth: Secondary | ICD-10-CM | POA: Diagnosis present

## 2017-04-17 DIAGNOSIS — A6000 Herpesviral infection of urogenital system, unspecified: Secondary | ICD-10-CM | POA: Diagnosis present

## 2017-04-17 DIAGNOSIS — Z8619 Personal history of other infectious and parasitic diseases: Secondary | ICD-10-CM | POA: Diagnosis present

## 2017-04-17 DIAGNOSIS — Z6791 Unspecified blood type, Rh negative: Secondary | ICD-10-CM | POA: Diagnosis not present

## 2017-04-17 DIAGNOSIS — O4202 Full-term premature rupture of membranes, onset of labor within 24 hours of rupture: Principal | ICD-10-CM | POA: Diagnosis present

## 2017-04-17 DIAGNOSIS — Z3400 Encounter for supervision of normal first pregnancy, unspecified trimester: Secondary | ICD-10-CM

## 2017-04-17 DIAGNOSIS — Z3A39 39 weeks gestation of pregnancy: Secondary | ICD-10-CM | POA: Diagnosis not present

## 2017-04-17 DIAGNOSIS — O99820 Streptococcus B carrier state complicating pregnancy: Secondary | ICD-10-CM

## 2017-04-17 DIAGNOSIS — O4290 Premature rupture of membranes, unspecified as to length of time between rupture and onset of labor, unspecified weeks of gestation: Secondary | ICD-10-CM | POA: Diagnosis present

## 2017-04-17 DIAGNOSIS — F909 Attention-deficit hyperactivity disorder, unspecified type: Secondary | ICD-10-CM | POA: Diagnosis present

## 2017-04-17 DIAGNOSIS — Z880 Allergy status to penicillin: Secondary | ICD-10-CM | POA: Diagnosis not present

## 2017-04-17 DIAGNOSIS — O26899 Other specified pregnancy related conditions, unspecified trimester: Secondary | ICD-10-CM

## 2017-04-17 LAB — COMPREHENSIVE METABOLIC PANEL
ALK PHOS: 253 U/L — AB (ref 38–126)
ALT: 21 U/L (ref 14–54)
AST: 29 U/L (ref 15–41)
Albumin: 3.3 g/dL — ABNORMAL LOW (ref 3.5–5.0)
Anion gap: 10 (ref 5–15)
BUN: 7 mg/dL (ref 6–20)
CALCIUM: 9.1 mg/dL (ref 8.9–10.3)
CHLORIDE: 107 mmol/L (ref 101–111)
CO2: 18 mmol/L — ABNORMAL LOW (ref 22–32)
CREATININE: 0.85 mg/dL (ref 0.44–1.00)
GFR calc Af Amer: >60 mL/min (ref >60)
Glucose, Bld: 84 mg/dL (ref 65–99)
Potassium: 4.2 mmol/L (ref 3.5–5.1)
Sodium: 135 mmol/L (ref 135–145)
Total Bilirubin: 0.4 mg/dL (ref 0.3–1.2)
Total Protein: 7 g/dL (ref 6.5–8.1)

## 2017-04-17 LAB — CBC
HCT: 37.3 % (ref 36.0–46.0)
Hemoglobin: 13.1 g/dL (ref 12.0–15.0)
MCH: 31.2 pg (ref 26.0–34.0)
MCHC: 35.1 g/dL (ref 30.0–36.0)
MCV: 88.8 fL (ref 78.0–100.0)
PLATELETS: 214 10*3/uL (ref 150–400)
RBC: 4.2 MIL/uL (ref 3.87–5.11)
RDW: 14.8 % (ref 11.5–15.5)
WBC: 9.3 10*3/uL (ref 4.0–10.5)

## 2017-04-17 LAB — POCT FERN TEST: POCT Fern Test: POSITIVE

## 2017-04-17 MED ORDER — PHENYLEPHRINE 40 MCG/ML (10ML) SYRINGE FOR IV PUSH (FOR BLOOD PRESSURE SUPPORT)
80.0000 ug | PREFILLED_SYRINGE | INTRAVENOUS | Status: AC | PRN
  Administered 2017-04-18 (×3): 80 ug via INTRAVENOUS

## 2017-04-17 MED ORDER — LACTATED RINGERS IV SOLN
INTRAVENOUS | Status: DC
  Administered 2017-04-17 – 2017-04-18 (×5): via INTRAVENOUS

## 2017-04-17 MED ORDER — DIPHENHYDRAMINE HCL 50 MG/ML IJ SOLN
12.5000 mg | INTRAMUSCULAR | Status: DC | PRN
  Administered 2017-04-18: 12.5 mg via INTRAVENOUS
  Filled 2017-04-17: qty 1

## 2017-04-17 MED ORDER — OXYTOCIN 40 UNITS IN LACTATED RINGERS INFUSION - SIMPLE MED
2.5000 [IU]/h | INTRAVENOUS | Status: DC
  Administered 2017-04-18: 2.5 [IU]/h via INTRAVENOUS
  Filled 2017-04-17: qty 1000

## 2017-04-17 MED ORDER — OXYCODONE-ACETAMINOPHEN 5-325 MG PO TABS: 2.0000 | ORAL_TABLET | ORAL | Status: DC | PRN

## 2017-04-17 MED ORDER — ONDANSETRON HCL 4 MG/2ML IJ SOLN
4.0000 mg | Freq: Four times a day (QID) | INTRAMUSCULAR | Status: DC | PRN
  Administered 2017-04-18 (×2): 4 mg via INTRAVENOUS
  Filled 2017-04-17 (×2): qty 2

## 2017-04-17 MED ORDER — LACTATED RINGERS IV SOLN
500.0000 mL | Freq: Once | INTRAVENOUS | Status: AC
  Administered 2017-04-17: 500 mL via INTRAVENOUS

## 2017-04-17 MED ORDER — ACETAMINOPHEN 325 MG PO TABS
650.0000 mg | ORAL_TABLET | ORAL | Status: DC | PRN
  Administered 2017-04-17 (×2): 650 mg via ORAL
  Filled 2017-04-17 (×2): qty 2

## 2017-04-17 MED ORDER — EPHEDRINE 5 MG/ML INJ
10.0000 mg | INTRAVENOUS | Status: DC | PRN
  Filled 2017-04-17: qty 2

## 2017-04-17 MED ORDER — OXYCODONE-ACETAMINOPHEN 5-325 MG PO TABS: 1.0000 | ORAL_TABLET | ORAL | Status: DC | PRN

## 2017-04-17 MED ORDER — LIDOCAINE HCL (PF) 1 % IJ SOLN
30.0000 mL | INTRAMUSCULAR | Status: DC | PRN
  Administered 2017-04-18: 30 mL via SUBCUTANEOUS
  Filled 2017-04-17: qty 30

## 2017-04-17 MED ORDER — FENTANYL 2.5 MCG/ML BUPIVACAINE 1/10 % EPIDURAL INFUSION (WH - ANES)
14.0000 mL/h | INTRAMUSCULAR | Status: DC | PRN
  Administered 2017-04-17 – 2017-04-18 (×3): 14 mL/h via EPIDURAL
  Filled 2017-04-17 (×3): qty 100

## 2017-04-17 MED ORDER — FENTANYL 2.5 MCG/ML BUPIVACAINE 1/10 % EPIDURAL INFUSION (WH - ANES): 14.0000 mL/h | INTRAMUSCULAR | Status: DC | PRN

## 2017-04-17 MED ORDER — OXYTOCIN BOLUS FROM INFUSION
500.0000 mL | Freq: Once | INTRAVENOUS | Status: AC
  Administered 2017-04-18: 500 mL via INTRAVENOUS

## 2017-04-17 MED ORDER — LACTATED RINGERS IV SOLN
500.0000 mL | INTRAVENOUS | Status: DC | PRN
  Administered 2017-04-18 (×2): 500 mL via INTRAVENOUS

## 2017-04-17 MED ORDER — SOD CITRATE-CITRIC ACID 500-334 MG/5ML PO SOLN: 30.0000 mL | ORAL | Status: DC | PRN

## 2017-04-17 MED ORDER — PHENYLEPHRINE 40 MCG/ML (10ML) SYRINGE FOR IV PUSH (FOR BLOOD PRESSURE SUPPORT): 80.0000 ug | PREFILLED_SYRINGE | INTRAVENOUS | Status: DC | PRN

## 2017-04-17 MED ORDER — PHENYLEPHRINE 40 MCG/ML (10ML) SYRINGE FOR IV PUSH (FOR BLOOD PRESSURE SUPPORT)
80.0000 ug | PREFILLED_SYRINGE | INTRAVENOUS | Status: DC | PRN
  Filled 2017-04-17: qty 10
  Filled 2017-04-17: qty 5

## 2017-04-17 MED ORDER — TERBUTALINE SULFATE 1 MG/ML IJ SOLN
0.2500 mg | Freq: Once | INTRAMUSCULAR | Status: AC | PRN
  Administered 2017-04-18: 0.25 mg via SUBCUTANEOUS
  Filled 2017-04-17: qty 1

## 2017-04-17 MED ORDER — OXYTOCIN 40 UNITS IN LACTATED RINGERS INFUSION - SIMPLE MED
1.0000 m[IU]/min | INTRAVENOUS | Status: DC
  Administered 2017-04-17: 2 m[IU]/min via INTRAVENOUS
  Filled 2017-04-17: qty 1000

## 2017-04-17 MED ORDER — CLINDAMYCIN PHOSPHATE 900 MG/50ML IV SOLN
900.0000 mg | Freq: Three times a day (TID) | INTRAVENOUS | Status: DC
  Administered 2017-04-17 – 2017-04-18 (×5): 900 mg via INTRAVENOUS
  Filled 2017-04-17 (×6): qty 50

## 2017-04-17 MED ORDER — FENTANYL CITRATE (PF) 100 MCG/2ML IJ SOLN
100.0000 ug | INTRAMUSCULAR | Status: DC | PRN
  Administered 2017-04-17 (×2): 100 ug via INTRAVENOUS
  Filled 2017-04-17 (×2): qty 2

## 2017-04-17 NOTE — Anesthesia Preprocedure Evaluation (Signed)
Anesthesia Evaluation  Patient identified by MRN, date of birth, ID band Patient awake    Reviewed: Allergy & Precautions, NPO status , Patient's Chart, lab work & pertinent test results  Airway Mallampati: II  TM Distance: >3 FB Neck ROM: Full    Dental no notable dental hx. (+) Dental Advisory Given   Pulmonary neg pulmonary ROS,    Pulmonary exam normal        Cardiovascular negative cardio ROS Normal cardiovascular exam     Neuro/Psych PSYCHIATRIC DISORDERS Anxiety negative neurological ROS     GI/Hepatic negative GI ROS, Neg liver ROS,   Endo/Other  negative endocrine ROS  Renal/GU negative Renal ROS  negative genitourinary   Musculoskeletal negative musculoskeletal ROS (+)   Abdominal   Peds negative pediatric ROS (+)  Hematology negative hematology ROS (+)   Anesthesia Other Findings   Reproductive/Obstetrics (+) Pregnancy                             Anesthesia Physical Anesthesia Plan  ASA: II  Anesthesia Plan: Epidural   Post-op Pain Management:    Induction:   PONV Risk Score and Plan:   Airway Management Planned: Natural Airway  Additional Equipment:   Intra-op Plan:   Post-operative Plan:   Informed Consent: I have reviewed the patients History and Physical, chart, labs and discussed the procedure including the risks, benefits and alternatives for the proposed anesthesia with the patient or authorized representative who has indicated his/her understanding and acceptance.   Dental advisory given  Plan Discussed with: Anesthesiologist  Anesthesia Plan Comments:         Anesthesia Quick Evaluation

## 2017-04-17 NOTE — MAU Note (Signed)
+  contractions  Through the night  Rating pain 7/10  More in back  +LOF 4am "big gush" Clear fluid Has continued  Denies vaginal bleeding  Has not felt baby move today  1.5cm dilated last wednesday

## 2017-04-17 NOTE — Anesthesia Pain Management Evaluation Note (Signed)
  CRNA Pain Management Visit Note  Patient: Denise [name], 26 y.o., female  "Hello I am a member of the anesthesia team at [facility]. We have an anesthesia team available at all times to provide care throughout the hospital, including epidural management and anesthesia for C-section. I don't know your plan for the delivery whether it a natural birth, water birth, IV sedation, nitrous supplementation, doula or epidural, but we want to meet your pain goals."   1.Was your pain managed to your expectations on prior hospitalizations?   No prior hospitalizations  2.What is your expectation for pain management during this hospitalization?     Epidural  3.How can we help you reach that goal? epidural  Record the patient's initial score and the patient's pain goal.   Pain: 3  Pain Goal: 4 [facility] wants you to be able to say your pain was always managed very well.  Wyndi Northrup 04/17/2017

## 2017-04-17 NOTE — H&P (Signed)
LABOR AND DELIVERY ADMISSION HISTORY AND PHYSICAL NOTE  QUINCIE HAROON is a 26 y.o. female G1P0000 with IUP at [redacted]w[redacted]d by LMP consistent with LMP presenting for SROM at 12A on 04/17/2017.   She reports positive fetal movement. She denies leakage of fluid or vaginal bleeding.  Prenatal History/Complications:  Past Medical History: Past Medical History:  Diagnosis Date  . ADHD (attention deficit hyperactivity disorder)   . Anxiety     Past Surgical History: Past Surgical History:  Procedure Laterality Date  . NO PAST SURGERIES      Obstetrical History: OB History    Gravida Para Term Preterm AB Living   1 0 0 0 0 0   SAB TAB Ectopic Multiple Live Births   0 0 0 0 0      Social History: Social History   Social History  . Marital status: Single    Spouse name: N/A  . Number of children: N/A  . Years of education: N/A   Social History Main Topics  . Smoking status: Never Smoker  . Smokeless tobacco: Never Used  . Alcohol use No  . Drug use: No  . Sexual activity: Not Currently    Birth control/ protection: None   Other Topics Concern  . None   Social History Narrative  . None    Family History: Family History  Problem Relation Age of Onset  . Multiple sclerosis Mother   . Cancer Mother   . Cancer Other   . Stroke Other     Allergies: Allergies  Allergen Reactions  . Cephalosporins Anaphylaxis and Hives    Prescriptions Prior to Admission  Medication Sig Dispense Refill Last Dose  . famotidine (PEPCID) 20 MG tablet Take 1 tablet (20 mg total) by mouth 2 (two) times daily. 60 tablet 5 04/16/2017 at Unknown time  . Prenatal Vit-Fe Fumarate-FA (PRENATAL MULTIVITAMIN) TABS tablet Take 1 tablet by mouth daily.    04/16/2017 at Unknown time  . valACYclovir (VALTREX) 500 MG tablet Take 1 tablet (500 mg total) by mouth 2 (two) times daily. 60 tablet 6 04/16/2017 at Unknown time  . metoCLOPramide (REGLAN) 10 MG tablet Take 1 tablet (10 mg total) by mouth 4 (four)  times daily as needed for nausea or vomiting. (Patient not taking: Reported on 04/11/2017) 30 tablet 5 Not Taking  . ondansetron (ZOFRAN ODT) 4 MG disintegrating tablet Take 1 tablet (4 mg total) by mouth every 6 (six) hours as needed for nausea. (Patient not taking: Reported on 03/29/2017) 20 tablet 5 Not Taking     Review of Systems   All systems reviewed and negative except as stated in HPI  Blood pressure 126/74, pulse 85, temperature 98.6 F (37 C), temperature source Oral, resp. rate 20, height 5\' 3"  (1.6 m), weight 140 lb (63.5 kg), SpO2 96 %. General appearance: alert, cooperative and appears stated age Lungs: no respiratory distress Heart: regular rate and rhythm Abdomen: soft, non-tender; bowel sounds normal Extremities: No calf swelling or tenderness Presentation: cephalic by nursing exam Fetal monitoring: 145/mod var/reactive Uterine activity: contractions q 4-5 minutes Dilation: 2.5 Effacement (%): 70 Station: -2 Exam by:: christy white,rnc   Prenatal labs: ABO, Rh: O/Negative/-- (11/15 0000) Antibody: Negative (04/02 1020) Rubella: non-immune RPR: Non Reactive (04/02 1020)  HBsAg: Negative (11/15 0000)  HIV: Non Reactive (04/02 1020)  GBS: Positive (05/23 0000)  2 hr Glucola: normal Genetic screening:  Quad negative Anatomy US: normal  Prenatal Transfer Tool  Maternal Diabetes: No Genetic Screening: Normal Maternal  Ultrasounds/Referrals: Normal Fetal Ultrasounds or other Referrals:  None Maternal Substance Abuse:  No Significant Maternal Medications:  None Significant Maternal Lab Results: Lab values include: Group B Strep positive  Results for orders placed or performed during the hospital encounter of 04/17/17 (from the past 24 hour(s))  POCT fern test   Collection Time: 04/17/17  9:20 AM  Result Value Ref Range   POCT Fern Test Positive = ruptured amniotic membanes   CBC   Collection Time: 04/17/17  9:45 AM  Result Value Ref Range   WBC 9.3 4.0 -  10.5 K/uL   RBC 4.20 3.87 - 5.11 MIL/uL   Hemoglobin 13.1 12.0 - 15.0 g/dL   HCT 16.137.3 09.636.0 - 04.546.0 %   MCV 88.8 78.0 - 100.0 fL   MCH 31.2 26.0 - 34.0 pg   MCHC 35.1 30.0 - 36.0 g/dL   RDW 40.914.8 81.111.5 - 91.415.5 %   Platelets 214 150 - 400 K/uL  Comprehensive metabolic panel   Collection Time: 04/17/17  9:45 AM  Result Value Ref Range   Sodium 135 135 - 145 mmol/L   Potassium 4.2 3.5 - 5.1 mmol/L   Chloride 107 101 - 111 mmol/L   CO2 18 (L) 22 - 32 mmol/L   Glucose, Bld 84 65 - 99 mg/dL   BUN 7 6 - 20 mg/dL   Creatinine, Ser 7.820.85 0.44 - 1.00 mg/dL   Calcium 9.1 8.9 - 95.610.3 mg/dL   Total Protein 7.0 6.5 - 8.1 g/dL   Albumin 3.3 (L) 3.5 - 5.0 g/dL   AST 29 15 - 41 U/L   ALT 21 14 - 54 U/L   Alkaline Phosphatase 253 (H) 38 - 126 U/L   Total Bilirubin 0.4 0.3 - 1.2 mg/dL   GFR calc non Af Amer >60 >60 mL/min   GFR calc Af Amer >60 >60 mL/min   Anion gap 10 5 - 15    Patient Active Problem List   Diagnosis Date Noted  . Normal labor 04/17/2017  . GBS (group B Streptococcus carrier), +RV culture, currently pregnant 03/29/2017  . History of herpes genitalis 03/29/2017  . Rh negative, antepartum 01/16/2017  . Supervision of normal pregnancy, antepartum 11/21/2016  . Insomnia 04/19/2015  . ADHD (attention deficit hyperactivity disorder) 01/04/2015  . Pityriasis rosea 01/04/2015    Assessment: Dartha LodgeChelsea D Levey is a 26 y.o. G1P0000 at 4677w4d here for SROm at 12 AM  #Labor:SROM 12A will start pitocin if not laboring aroung [phone] #Pain: IV pain medications - may want epdirual #FWB: Category 1 #ID:  GBS pos -PCN allergic - start clindamycin #MOF: breast #MOC: unsure #Circ:  Possibly in patient  Ernestina Pennaicholas Schenk 04/17/2017, 10:52 AM

## 2017-04-17 NOTE — Progress Notes (Signed)
Labor Progress Note Denise LodgeChelsea D [name] is a 26 y.o. G1P0000 at 6056w4d presented for PROM at 1200.  S: Intensifying contractions. Coping with breathing for now. Has good family support in the room.   O:  BP 120/79   Pulse 83   Temp 98.3 F (36.8 C) (Oral)   Resp 18   Ht 5\' 3"  (1.6 m)   Wt 63.5 kg (140 lb)   SpO2 96%   BMI 24.80 kg/m  EFM: 135/mod/acc  CVE: Dilation: 4 Effacement (%): 90 Cervical Position: Middle Station: -2 Presentation: Vertex Exam by:: Mary SwazilandJordan Johnson, RN    A&P: 26 y.o. G1P0000 7256w4d admitted for PROM #Labor: Progressing well with augmentation. SVE: 4/90/-2  #Pain: Desires epidural eventually  #FWB: Cat I #GBS positive, continue clindamycin given PCN allergy    Al CorpusMatthew R [name] Smeal, MD 10:10 PM

## 2017-04-18 ENCOUNTER — Encounter: Payer: Medicaid Other | Admitting: Obstetrics and Gynecology

## 2017-04-18 ENCOUNTER — Encounter (HOSPITAL_COMMUNITY): Payer: Self-pay

## 2017-04-18 DIAGNOSIS — Z3A39 39 weeks gestation of pregnancy: Secondary | ICD-10-CM

## 2017-04-18 DIAGNOSIS — O99824 Streptococcus B carrier state complicating childbirth: Secondary | ICD-10-CM

## 2017-04-18 LAB — RPR: RPR: NONREACTIVE

## 2017-04-18 MED ORDER — TETANUS-DIPHTH-ACELL PERTUSSIS 5-2.5-18.5 LF-MCG/0.5 IM SUSP: 0.5000 mL | Freq: Once | INTRAMUSCULAR | Status: DC

## 2017-04-18 MED ORDER — SENNOSIDES-DOCUSATE SODIUM 8.6-50 MG PO TABS
2.0000 | ORAL_TABLET | ORAL | Status: DC
  Administered 2017-04-18: 2 via ORAL
  Filled 2017-04-18 (×2): qty 2

## 2017-04-18 MED ORDER — IBUPROFEN 600 MG PO TABS
600.0000 mg | ORAL_TABLET | Freq: Four times a day (QID) | ORAL | Status: DC
  Administered 2017-04-18 – 2017-04-20 (×7): 600 mg via ORAL
  Filled 2017-04-18 (×7): qty 1

## 2017-04-18 MED ORDER — ONDANSETRON HCL 4 MG/2ML IJ SOLN: 4.0000 mg | INTRAMUSCULAR | Status: DC | PRN

## 2017-04-18 MED ORDER — ZOLPIDEM TARTRATE 5 MG PO TABS: 5.0000 mg | ORAL_TABLET | Freq: Every evening | ORAL | Status: DC | PRN

## 2017-04-18 MED ORDER — WITCH HAZEL-GLYCERIN EX PADS: 1.0000 "application " | MEDICATED_PAD | CUTANEOUS | Status: DC | PRN

## 2017-04-18 MED ORDER — DIPHENHYDRAMINE HCL 25 MG PO CAPS: 25.0000 mg | ORAL_CAPSULE | Freq: Four times a day (QID) | ORAL | Status: DC | PRN

## 2017-04-18 MED ORDER — PRENATAL MULTIVITAMIN CH: 1.0000 | ORAL_TABLET | Freq: Every day | ORAL | Status: DC

## 2017-04-18 MED ORDER — SENNOSIDES-DOCUSATE SODIUM 8.6-50 MG PO TABS: 2.0000 | ORAL_TABLET | ORAL | Status: DC

## 2017-04-18 MED ORDER — ONDANSETRON HCL 4 MG PO TABS
4.0000 mg | ORAL_TABLET | ORAL | Status: DC | PRN
Start: 2017-04-18 — End: 2017-04-20

## 2017-04-18 MED ORDER — LIDOCAINE HCL (PF) 1 % IJ SOLN
INTRAMUSCULAR | Status: DC | PRN
  Administered 2017-04-17 (×2): 5 mL via EPIDURAL

## 2017-04-18 MED ORDER — IBUPROFEN 600 MG PO TABS: 600.0000 mg | ORAL_TABLET | Freq: Four times a day (QID) | ORAL | Status: DC

## 2017-04-18 MED ORDER — ONDANSETRON HCL 4 MG PO TABS: 4.0000 mg | ORAL_TABLET | ORAL | Status: DC | PRN

## 2017-04-18 MED ORDER — PRENATAL MULTIVITAMIN CH
1.0000 | ORAL_TABLET | Freq: Every day | ORAL | Status: DC
  Administered 2017-04-19 – 2017-04-20 (×2): 1 via ORAL
  Filled 2017-04-18 (×2): qty 1

## 2017-04-18 MED ORDER — SIMETHICONE 80 MG PO CHEW: 80.0000 mg | CHEWABLE_TABLET | ORAL | Status: DC | PRN

## 2017-04-18 MED ORDER — DIBUCAINE 1 % RE OINT: 1.0000 "application " | TOPICAL_OINTMENT | RECTAL | Status: DC | PRN

## 2017-04-18 MED ORDER — COCONUT OIL OIL
1.0000 "application " | TOPICAL_OIL | Status: DC | PRN
  Filled 2017-04-18: qty 120

## 2017-04-18 MED ORDER — BENZOCAINE-MENTHOL 20-0.5 % EX AERO
1.0000 "application " | INHALATION_SPRAY | CUTANEOUS | Status: DC | PRN
  Administered 2017-04-19: 1 via TOPICAL
  Filled 2017-04-18: qty 56

## 2017-04-18 MED ORDER — ACETAMINOPHEN 325 MG PO TABS: 650.0000 mg | ORAL_TABLET | ORAL | Status: DC | PRN

## 2017-04-18 MED ORDER — ACETAMINOPHEN 325 MG PO TABS
650.0000 mg | ORAL_TABLET | ORAL | Status: DC | PRN
Start: 2017-04-18 — End: 2017-04-20
  Administered 2017-04-19 – 2017-04-20 (×2): 650 mg via ORAL
  Filled 2017-04-18 (×2): qty 2

## 2017-04-18 MED ORDER — BENZOCAINE-MENTHOL 20-0.5 % EX AERO: 1.0000 "application " | INHALATION_SPRAY | CUTANEOUS | Status: DC | PRN

## 2017-04-18 MED ORDER — COCONUT OIL OIL: 1.0000 "application " | TOPICAL_OIL | Status: DC | PRN

## 2017-04-18 NOTE — Progress Notes (Signed)
Labor Progress Note Denise [name] is a 26 y.o. G1P0000 at 5455w4d presented for PROM at 1200.  S: Comfortable s/p epidural.   O:  BP (!) 104/53   Pulse (!) 140   Temp 98 F (36.7 C) (Oral)   Resp 18   Ht 5\' 3"  (1.6 m)   Wt 63.5 kg (140 lb)   SpO2 100%   BMI 24.80 kg/m  EFM: 135/mod/acc  CVE: Dilation: 4.5 Effacement (%): 90 Cervical Position: Middle Station: -1 Presentation: Vertex Exam by:: Mary [place]azilandJordan Johnson, RN   A&P: 26 y.o. G1P0000 7855w4d admitted for PROM #Labor: Progressing with augmentation. Latent phase. Last SVE: 4.5/90/-1. Restarted pit at 0305 after 1 hour break given recurrent late decles.Titrate per protocol.  #Pain: Epidural #FWB: Cat I #GBS positive, continue clindamycin given PCN allergy    Al CorpusMatthew R Dajanee Voorheis, MD 3:33 AM

## 2017-04-18 NOTE — Progress Notes (Signed)
Labor Progress Note  Denise [name] is a 26 y.o. G1P0000 at 4044w5d  admitted for PROM   FHT: 130 bmp, mod var, accels no decels UC:   [id number] SVE:   Dilation: 5 Effacement (%): 90 Station: -1 Exam by:: Zeitler  Assessment / Plan: 26 y.o. G1P0000 6244w5d admitted for PROM  Labor: Progressing on Pitocin Fetal Wellbeing:  Cat 1 Pain Control:  Epidural Anticipated MOD:  NSVD  Expectant management  Howard PouchLauren Mina Carlisi, MD PGY-1 [facility]

## 2017-04-18 NOTE — Progress Notes (Signed)
Labor Progress Note Denise [name] is a 26 y.o. G1P0000 at 6058w4d presented for PROM at 1200.   S: Comfortable s/p epidural. Getting frustrated with protracted course.   O:  BP (!) 106/56   Pulse (!) 111   Temp 98.1 F (36.7 C) (Oral)   Resp 18   Ht 5\' 3"  (1.6 m)   Wt 63.5 kg (140 lb)   SpO2 100%   BMI 24.80 kg/m  EFM: 150/mod/acc  CVE: Dilation: 5 Effacement (%): 90 Cervical Position: Middle Station: -1 Presentation: Vertex Exam by:: [name]  A&P: 26 y.o. G1P0000 1958w4d admitted for PROM #Labor: Progressing with augmentation. Latent phase. Last SVE: 5/90-1. AROM of forebag to bloody fluid. IUPC placed. Continue pitocin, titrate per protocol.  #Pain: Epidural #FWB: Cat I #GBS positive, continue clindamycin given PCN allergy    Al CorpusMatthew R Zach Tietje, MD 7:22 AM

## 2017-04-18 NOTE — Anesthesia Procedure Notes (Signed)
Epidural Patient location during procedure: OB Start time: 04/17/2017 11:38 PM End time: 04/17/2017 11:57 PM  Staffing Anesthesiologist: Heather RobertsSINGER, Elianne Gubser Performed: anesthesiologist   Preanesthetic Checklist Completed: patient identified, site marked, pre-op evaluation, timeout performed, IV checked, risks and benefits discussed and monitors and equipment checked  Epidural Patient position: sitting Prep: DuraPrep Patient monitoring: heart rate, cardiac monitor, continuous pulse ox and blood pressure Approach: midline Location: L2-L3 Injection technique: LOR saline  Needle:  Needle type: Tuohy  Needle gauge: 17 G Needle length: 9 cm Needle insertion depth: 6 cm Catheter size: 20 Guage Catheter at skin depth: 11 cm Test dose: negative and Other  Assessment Events: blood not aspirated, injection not painful, no injection resistance and negative IV test  Additional Notes Informed consent obtained prior to proceeding including risk of failure, 1% risk of PDPH, risk of minor discomfort and bruising.  Discussed rare but serious complications including epidural abscess, permanent nerve injury, epidural hematoma.  Discussed alternatives to epidural analgesia and patient desires to proceed.  Timeout performed pre-procedure verifying patient name, procedure, and platelet count.  Patient tolerated procedure well.

## 2017-04-18 NOTE — Progress Notes (Signed)
Denise [name] is a 26 y.o. G1P0000 at 9093w5d  admitted for PROM   Dilation: 5 Effacement (%): 90 Cervical Position: Middle Station: 0 Presentation: Vertex Exam by:: S brendle RN   FHT: 140 bpm, mod var, +accels, no decels TOCO: q2-644min   A/P: Labor: progressing normally Pain: epidural FWB: Cat I   Continue expectant management Anticipate SVD  Denise PouchLauren Leane Loring, MD PGY-1 [facility]

## 2017-04-18 NOTE — Progress Notes (Addendum)
Labor Progress Note Denise LodgeChelsea D [name] is a 26 y.o. G1P0000 at 702w4d presented for PROM at 1200.  S: Comfortable s/p epidural.   O:  BP 112/69   Pulse 74   Temp 98.3 F (36.8 C) (Oral)   Resp 16   Ht 5\' 3"  (1.6 m)   Wt 63.5 kg (140 lb)   SpO2 100%   BMI 24.80 kg/m  EFM: 135/mod/acc/late decels  CVE: Dilation: 4.5 Effacement (%): 90 Cervical Position: Middle Station: -1 Presentation: Vertex Exam by:: Mary SwazilandJordan Johnson, RN   A&P: 26 y.o. G1P0000 602w4d admitted for PROM #Labor: Progressing with augmentation. Latent phase. Last SVE: 4.5/90/-1  #Pain: Epidural #FWB: Cat II: phenylephrine, fluid bolus, position changes, decrease pitocin from 16 u to 8 u #GBS positive, continue clindamycin given PCN allergy    Al CorpusMatthew R Ivar Domangue, MD 1:21 AM

## 2017-04-19 DIAGNOSIS — O4290 Premature rupture of membranes, unspecified as to length of time between rupture and onset of labor, unspecified weeks of gestation: Secondary | ICD-10-CM | POA: Diagnosis present

## 2017-04-19 MED ORDER — RHO D IMMUNE GLOBULIN 1500 UNIT/2ML IJ SOSY
300.0000 ug | PREFILLED_SYRINGE | Freq: Once | INTRAMUSCULAR | Status: DC
  Filled 2017-04-19: qty 2

## 2017-04-19 MED ORDER — RHO D IMMUNE GLOBULIN 1500 UNIT/2ML IJ SOSY
300.0000 ug | PREFILLED_SYRINGE | Freq: Once | INTRAMUSCULAR | Status: AC
  Administered 2017-04-19: 300 ug via INTRAVENOUS
  Filled 2017-04-19: qty 2

## 2017-04-19 MED ORDER — MEASLES, MUMPS & RUBELLA VAC ~~LOC~~ INJ
0.5000 mL | INJECTION | Freq: Once | SUBCUTANEOUS | Status: AC
  Administered 2017-04-20: 0.5 mL via SUBCUTANEOUS
  Filled 2017-04-19: qty 0.5

## 2017-04-19 NOTE — Lactation Note (Signed)
This note was copied from a baby's chart. Lactation Consultation Note New mom has round breast very firm, tight feeling, inverted nipples. Massaged breast, hand expression taught w/colostrum noted. Mom stated her Lt. Breast had been leaking for a few weeks. Used hand pump to evert moms' nipples, didn't relieve any colostrum. Mom doesn't have edema to LE. Hand expressed 20 ml colostrum. Noted softening of breast. Encouraged to feel breast before and after feeding for transfer of colostrum.  Fitted mom w/#16 NS. Mom needs more practice w/application of NS.  Shells given to wear in bra, strongly encouraged mom to wear them. Encouraged mom to pre-pump w/hand pump prior to application of NS so she can tell wear to apply it.  Mom shown how to use DEBP & how to disassemble, clean, & reassemble parts. Mom knows to pump q3h for 15-20 min. Mom encouraged to feed baby 8-12 times/24 hours and with feeding cues. Wake baby if hasn't cued in 3 hrs. Baby is spitty, taught mom signs for that and using suction bulb. Educated newborn behavior and feeding habits, I&O, cluster feeding, supply and demand.  Baby awake, not spitty at that moment, cueing. Spoon fed  Colostrum tolerated well. Assisted in football position and latching. Baby fussing at breast occasionally.  Discussed comfort props, support and relaxing while BF. Mom tense. Mom stated she was nervous, she didn't know what to do. Mom ask about sounds baby makes as if what is he doing.  Encouraged cheeks to breast. Call for assistance if needed. Praised mom for a good job BF and so much colostrum. WH/LC brochure given w/resources, support groups and LC services.  Patient Name: Denise [name]   ZOXWR'UToday's Date: 04/19/2017 Reason for consult: Initial assessment   Maternal Data Has patient been taught Hand Expression?: Yes Does the patient have breastfeeding experience prior to this delivery?: No  Feeding Feeding Type: Breast Fed Length of feed: 15 min  (still BF)  LATCH Score/Interventions Latch: Repeated attempts needed to sustain latch, nipple held in mouth throughout feeding, stimulation needed to elicit sucking reflex. Intervention(s): Skin to skin;Teach feeding cues;Waking techniques Intervention(s): Adjust position;Assist with latch;Breast massage;Breast compression  Audible Swallowing: A few with stimulation Intervention(s): Skin to skin;Hand expression Intervention(s): Hand expression  Type of Nipple: Inverted Intervention(s): Shells;Hand pump;Double electric pump  Comfort (Breast/Nipple): Soft / non-tender     Hold (Positioning): Full assist, staff holds infant at breast Intervention(s): Breastfeeding basics reviewed;Support Pillows;Position options;Skin to skin  LATCH Score: 4  Lactation Tools Discussed/Used Tools: Shells;Nipple Dorris CarnesShields;Pump Nipple shield size: 16 Shell Type: Inverted Breast pump type: Double-Electric Breast Pump Pump Review: Setup, frequency, and cleaning;Milk Storage Initiated by:: Peri JeffersonL. Denise Persky RN IBCLC Date initiated:: 04/19/17   Consult Status Consult Status: Follow-up Date: 04/19/17 Follow-up type: In-patient    Denise [name], Diamond NickelLAURA [name] 04/19/2017, 1:18 AM

## 2017-04-19 NOTE — Lactation Note (Signed)
This note was copied from a baby's chart. Lactation Consultation Note  Patient Name: Denise [name] [id number]'s Date: 04/19/2017 Reason for consult: Follow-up assessment  Mom says that infant just finished a feeding & that "he is good." She denies questions & declines need for a consult.  Lurline HareRichey, Ikey Omary [facility] 04/19/2017, 2:28 PM

## 2017-04-19 NOTE — Progress Notes (Signed)
Post Partum Day #1 Subjective: up ad lib, voiding and tolerating PO  Objective: Blood pressure 113/63, pulse 80, temperature 98 F (36.7 C), temperature source Oral, resp. rate 14, height 5\' 3"  (1.6 m), weight 140 lb (63.5 kg), SpO2 100 %, unknown if currently breastfeeding.  Physical Exam:  General: alert, cooperative and no distress Lochia: appropriate Uterine Fundus: firm Incision: 2nd deg; healing DVT Evaluation: No evidence of DVT seen on physical exam. No cords or calf tenderness. No significant calf/ankle edema.   Recent Labs  04/17/17 0945  HGB 13.1  HCT 37.3    Assessment/Plan: Plan for discharge tomorrow, Breastfeeding, Lactation consult and Contraception LARK   LOS: 2 days   Roe CoombsRachelle A Denney, CNM 04/19/2017, 7:48 AM

## 2017-04-19 NOTE — Anesthesia Postprocedure Evaluation (Signed)
Anesthesia Post Note  Patient: Denise [name]  Procedure(s) Performed: * No procedures listed *     Patient location during evaluation: Mother Baby Anesthesia Type: Epidural Level of consciousness: awake and awake and alert Pain management: pain level controlled Vital Signs Assessment: post-procedure vital signs reviewed and stable Respiratory status: spontaneous breathing Cardiovascular status: stable Postop Assessment: no headache, no backache, epidural receding, patient able to bend at knees and no signs of nausea or vomiting Anesthetic complications: no    Last Vitals:  Vitals:   04/18/17 2318 04/19/17 0300  BP: 126/76 113/63  Pulse: 94 80  Resp: 16 14  Temp: 37.5 C 36.7 C    Last Pain:  Vitals:   04/19/17 0300  TempSrc: Oral  PainSc: 0-No pain   Pain Goal: Patients Stated Pain Goal: 5 (04/18/17 1600)               Edison PaceWILKERSON,Jesenia Spera

## 2017-04-20 LAB — RH IG WORKUP (INCLUDES ABO/RH)
ABO/RH(D): O NEG
Fetal Screen: NEGATIVE
GESTATIONAL AGE(WKS): 39.5
Unit division: 0

## 2017-04-20 MED ORDER — IBUPROFEN 600 MG PO TABS: 600.0000 mg | ORAL_TABLET | Freq: Four times a day (QID) | ORAL | 0 refills | Status: DC

## 2017-04-20 NOTE — Discharge Instructions (Signed)
Breastfeeding °Deciding to breastfeed is one of the best choices you can make for you and your baby. A change in hormones during pregnancy causes your breast tissue to grow and increases the number and size of your milk ducts. These hormones also allow proteins, sugars, and fats from your blood supply to make breast milk in your milk-producing glands. Hormones prevent breast milk from being released before your baby is born as well as prompt milk flow after birth. Once breastfeeding has begun, thoughts of your baby, as well as his or her sucking or crying, can stimulate the release of milk from your milk-producing glands. °Benefits of breastfeeding °For Your Baby °· Your first milk (colostrum) helps your baby's digestive system function better. °· There are antibodies in your milk that help your baby fight off infections. °· Your baby has a lower incidence of asthma, allergies, and sudden infant death syndrome. °· The nutrients in breast milk are better for your baby than infant formulas and are designed uniquely for your baby’s needs. °· Breast milk improves your baby's brain development. °· Your baby is less likely to develop other conditions, such as childhood obesity, asthma, or type 2 diabetes mellitus. ° °For You °· Breastfeeding helps to create a very special bond between you and your baby. °· Breastfeeding is convenient. Breast milk is always available at the correct temperature and costs nothing. °· Breastfeeding helps to burn calories and helps you lose the weight gained during pregnancy. °· Breastfeeding makes your uterus contract to its prepregnancy size faster and slows bleeding (lochia) after you give birth. °· Breastfeeding helps to lower your risk of developing type 2 diabetes mellitus, osteoporosis, and breast or ovarian cancer later in life. ° °Signs that your baby is hungry °Early Signs of Hunger °· Increased alertness or activity. °· Stretching. °· Movement of the head from side to  side. °· Movement of the head and opening of the mouth when the corner of the mouth or cheek is stroked (rooting). °· Increased sucking sounds, smacking lips, cooing, sighing, or squeaking. °· Hand-to-mouth movements. °· Increased sucking of fingers or hands. ° °Late Signs of Hunger °· Fussing. °· Intermittent crying. ° °Extreme Signs of Hunger °Signs of extreme hunger will require calming and consoling before your baby will be able to breastfeed successfully. Do not wait for the following signs of extreme hunger to occur before you initiate breastfeeding: °· Restlessness. °· A loud, strong cry. °· Screaming. ° °Breastfeeding basics °Breastfeeding Initiation °· Find a comfortable place to sit or lie down, with your neck and back well supported. °· Place a pillow or rolled up blanket under your baby to bring him or her to the level of your breast (if you are seated). Nursing pillows are specially designed to help support your arms and your baby while you breastfeed. °· Make sure that your baby's abdomen is facing your abdomen. °· Gently massage your breast. With your fingertips, massage from your chest wall toward your nipple in a circular motion. This encourages milk flow. You may need to continue this action during the feeding if your milk flows slowly. °· Support your breast with 4 fingers underneath and your thumb above your nipple. Make sure your fingers are well away from your nipple and your baby’s mouth. °· Stroke your baby's lips gently with your finger or nipple. °· When your baby's mouth is open wide enough, quickly bring your baby to your breast, placing your entire nipple and as much of the colored area   around your nipple (areola) as possible into your baby's mouth. °? More areola should be visible above your baby's upper lip than below the lower lip. °? Your baby's tongue should be between his or her lower gum and your breast. °· Ensure that your baby's mouth is correctly positioned around your nipple  (latched). Your baby's lips should create a seal on your breast and be turned out (everted). °· It is common for your baby to suck about 2-3 minutes in order to start the flow of breast milk. ° °Latching °Teaching your baby how to latch on to your breast properly is very important. An improper latch can cause nipple pain and decreased milk supply for you and poor weight gain in your baby. Also, if your baby is not latched onto your nipple properly, he or she may swallow some air during feeding. This can make your baby fussy. Burping your baby when you switch breasts during the feeding can help to get rid of the air. However, teaching your baby to latch on properly is still the best way to prevent fussiness from swallowing air while breastfeeding. °Signs that your baby has successfully latched on to your nipple: °· Silent tugging or silent sucking, without causing you pain. °· Swallowing heard between every 3-4 sucks. °· Muscle movement above and in front of his or her ears while sucking. ° °Signs that your baby has not successfully latched on to nipple: °· Sucking sounds or smacking sounds from your baby while breastfeeding. °· Nipple pain. ° °If you think your baby has not latched on correctly, slip your finger into the corner of your baby’s mouth to break the suction and place it between your baby's gums. Attempt breastfeeding initiation again. °Signs of Successful Breastfeeding °Signs from your baby: °· A gradual decrease in the number of sucks or complete cessation of sucking. °· Falling asleep. °· Relaxation of his or her body. °· Retention of a small amount of milk in his or her mouth. °· Letting go of your breast by himself or herself. ° °Signs from you: °· Breasts that have increased in firmness, weight, and size 1-3 hours after feeding. °· Breasts that are softer immediately after breastfeeding. °· Increased milk volume, as well as a change in milk consistency and color by the fifth day of  breastfeeding. °· Nipples that are not sore, cracked, or bleeding. ° °Signs That Your Baby is Getting Enough Milk °· Wetting at least 1-2 diapers during the first 24 hours after birth. °· Wetting at least 5-6 diapers every 24 hours for the first week after birth. The urine should be clear or pale yellow by 5 days after birth. °· Wetting 6-8 diapers every 24 hours as your baby continues to grow and develop. °· At least 3 stools in a 24-hour period by age 5 days. The stool should be soft and yellow. °· At least 3 stools in a 24-hour period by age 7 days. The stool should be seedy and yellow. °· No loss of weight greater than 10% of birth weight during the first 3 days of age. °· Average weight gain of 4-7 ounces (113-198 g) per week after age 4 days. °· Consistent daily weight gain by age 5 days, without weight loss after the age of 2 weeks. ° °After a feeding, your baby may spit up a small amount. This is common. °Breastfeeding frequency and duration °Frequent feeding will help you make more milk and can prevent sore nipples and breast engorgement. Breastfeed when   you feel the need to reduce the fullness of your breasts or when your baby shows signs of hunger. This is called "breastfeeding on demand." Avoid introducing a pacifier to your baby while you are working to establish breastfeeding (the first 4-6 weeks after your baby is born). After this time you may choose to use a pacifier. Research has shown that pacifier use during the first year of a baby's life decreases the risk of sudden infant death syndrome (SIDS). °Allow your baby to feed on each breast as long as he or she wants. Breastfeed until your baby is finished feeding. When your baby unlatches or falls asleep while feeding from the first breast, offer the second breast. Because newborns are often sleepy in the first few weeks of life, you may need to awaken your baby to get him or her to feed. °Breastfeeding times will vary from baby to baby. However,  the following rules can serve as a guide to help you ensure that your baby is properly fed: °· Newborns (babies 4 weeks of age or younger) may breastfeed every 1-3 hours. °· Newborns should not go longer than 3 hours during the day or 5 hours during the night without breastfeeding. °· You should breastfeed your baby a minimum of 8 times in a 24-hour period until you begin to introduce solid foods to your baby at around 6 months of age. ° °Breast milk pumping °Pumping and storing breast milk allows you to ensure that your baby is exclusively fed your breast milk, even at times when you are unable to breastfeed. This is especially important if you are going back to work while you are still breastfeeding or when you are not able to be present during feedings. Your lactation consultant can give you guidelines on how long it is safe to store breast milk. °A breast pump is a machine that allows you to pump milk from your breast into a sterile bottle. The pumped breast milk can then be stored in a refrigerator or freezer. Some breast pumps are operated by hand, while others use electricity. Ask your lactation consultant which type will work best for you. Breast pumps can be purchased, but some hospitals and breastfeeding support groups lease breast pumps on a monthly basis. A lactation consultant can teach you how to hand express breast milk, if you prefer not to use a pump. °Caring for your breasts while you breastfeed °Nipples can become dry, cracked, and sore while breastfeeding. The following recommendations can help keep your breasts moisturized and healthy: °· Avoid using soap on your nipples. °· Wear a supportive bra. Although not required, special nursing bras and tank tops are designed to allow access to your breasts for breastfeeding without taking off your entire bra or top. Avoid wearing underwire-style bras or extremely tight bras. °· Air dry your nipples for 3-4 minutes after each feeding. °· Use only cotton  bra pads to absorb leaked breast milk. Leaking of breast milk between feedings is normal. °· Use lanolin on your nipples after breastfeeding. Lanolin helps to maintain your skin's normal moisture barrier. If you use pure lanolin, you do not need to wash it off before feeding your baby again. Pure lanolin is not toxic to your baby. You may also hand express a few drops of breast milk and gently massage that milk into your nipples and allow the milk to air dry. ° °In the first few weeks after giving birth, some women experience extremely full breasts (engorgement). Engorgement can make your   breasts feel heavy, warm, and tender to the touch. Engorgement peaks within 3-5 days after you give birth. The following recommendations can help ease engorgement: °· Completely empty your breasts while breastfeeding or pumping. You may want to start by applying warm, moist heat (in the shower or with warm water-soaked hand towels) just before feeding or pumping. This increases circulation and helps the milk flow. If your baby does not completely empty your breasts while breastfeeding, pump any extra milk after he or she is finished. °· Wear a snug bra (nursing or regular) or tank top for 1-2 days to signal your body to slightly decrease milk production. °· Apply ice packs to your breasts, unless this is too uncomfortable for you. °· Make sure that your baby is latched on and positioned properly while breastfeeding. ° °If engorgement persists after 48 hours of following these recommendations, contact your health care provider or a lactation consultant. °Overall health care recommendations while breastfeeding °· Eat healthy foods. Alternate between meals and snacks, eating 3 of each per day. Because what you eat affects your breast milk, some of the foods may make your baby more irritable than usual. Avoid eating these foods if you are sure that they are negatively affecting your baby. °· Drink milk, fruit juice, and water to  satisfy your thirst (about 10 glasses a day). °· Rest often, relax, and continue to take your prenatal vitamins to prevent fatigue, stress, and anemia. °· Continue breast self-awareness checks. °· Avoid chewing and smoking tobacco. Chemicals from cigarettes that pass into breast milk and exposure to secondhand smoke may harm your baby. °· Avoid alcohol and drug use, including marijuana. °Some medicines that may be harmful to your baby can pass through breast milk. It is important to ask your health care provider before taking any medicine, including all over-the-counter and prescription medicine as well as vitamin and herbal supplements. °It is possible to become pregnant while breastfeeding. If birth control is desired, ask your health care provider about options that will be safe for your baby. °Contact a health care provider if: °· You feel like you want to stop breastfeeding or have become frustrated with breastfeeding. °· You have painful breasts or nipples. °· Your nipples are cracked or bleeding. °· Your breasts are red, tender, or warm. °· You have a swollen area on either breast. °· You have a fever or chills. °· You have nausea or vomiting. °· You have drainage other than breast milk from your nipples. °· Your breasts do not become full before feedings by the fifth day after you give birth. °· You feel sad and depressed. °· Your baby is too sleepy to eat well. °· Your baby is having trouble sleeping. °· Your baby is wetting less than 3 diapers in a 24-hour period. °· Your baby has less than 3 stools in a 24-hour period. °· Your baby's skin or the white part of his or her eyes becomes yellow. °· Your baby is not gaining weight by 5 days of age. °Get help right away if: °· Your baby is overly tired (lethargic) and does not want to wake up and feed. °· Your baby develops an unexplained fever. °This information is not intended to replace advice given to you by your health care provider. Make sure you discuss  any questions you have with your health care provider. °Document Released: 10/16/2005 Document Revised: 03/29/2016 Document Reviewed: 04/09/2013 °[employer]vier Interactive Patient Education © 2017 [employer] °Home Care Instructions for Mom °ACTIVITY °·   Gradually return to your regular activities. °· Let yourself rest. Nap while your baby sleeps. °· Avoid lifting anything that is heavier than 10 lb (4.5 kg) until your health care provider says it is okay. °· Avoid activities that take a lot of effort and energy (are strenuous) until approved by your health care provider. Walking at a slow-to-moderate pace is usually safe. °· If you had a cesarean delivery: °? Do not vacuum, climb stairs, or drive a car for 4-6 weeks. °? Have someone help you at home until you feel like you can do your usual activities yourself. °? Do exercises as told by your health care provider, if this applies. ° °VAGINAL BLEEDING °You may continue to bleed for 4-6 weeks after delivery. Over time, the amount of blood usually decreases and the color of the blood usually gets lighter. However, the flow of bright red blood may increase if you have been too active. If you need to use more than one pad in an hour because your pad gets soaked, or if you pass a large clot: °· Lie down. °· Raise your feet. °· Place a cold compress on your lower abdomen. °· Rest. °· Call your health care provider. ° °If you are breastfeeding, your period should return anytime between 8 weeks after delivery and the time that you stop breastfeeding. If you are not breastfeeding, your period should return 6-8 weeks after delivery. °PERINEAL CARE °The perineal area, or perineum, is the part of your body between your thighs. After delivery, this area needs special care. Follow these instructions as told by your health care provider. °· Take warm tub baths for 15-20 minutes. °· Use medicated pads and pain-relieving sprays and creams as told. °· Do not use tampons or douches until  vaginal bleeding has stopped. °· Each time you go to the bathroom: °? Use a peri bottle. °? Change your pad. °? Use towelettes in place of toilet paper until your stitches have healed. °· Do Kegel exercises every day. Kegel exercises help to maintain the muscles that support the vagina, bladder, and bowels. You can do these exercises while you are standing, sitting, or lying down. To do Kegel exercises: °? Tighten the muscles of your abdomen and the muscles that surround your birth canal. °? Hold for a few seconds. °? Relax. °? Repeat until you have done this 5 times in a row. °· To prevent hemorrhoids from developing or getting worse: °? Drink enough fluid to keep your urine clear or pale yellow. °? Avoid straining when having a bowel movement. °? Take over-the-counter medicines and stool softeners as told by your health care provider. ° °BREAST CARE °· Wear a tight-fitting bra. °· Avoid taking over-the-counter pain medicine for breast discomfort. °· Apply ice to the breasts to help with discomfort as needed: °? Put ice in a plastic bag. °? Place a towel between your skin and the bag. °? Leave the ice on for 20 minutes or as told by your health care provider. ° °NUTRITION °· Eat a well-balanced diet. °· Do not try to lose weight quickly by cutting back on calories. °· Take your prenatal vitamins until your postpartum checkup or until your health care provider tells you to stop. ° °POSTPARTUM DEPRESSION °You may find yourself crying for no apparent reason and unable to cope with all of the changes that come with having a newborn. This mood is called postpartum depression. Postpartum depression happens because your hormone levels change after delivery. If you have   postpartum depression, get support from your partner, friends, and family. If the depression does not go away on its own after several weeks, contact your health care provider. °BREAST SELF-EXAM °Do a breast self-exam each month, at the same time of the  month. If you are breastfeeding, check your breasts just after a feeding, when your breasts are less full. If you are breastfeeding and your period has started, check your breasts on day 5, 6, or 7 of your period. °Report any lumps, bumps, or discharge to your health care provider. Know that breasts are normally lumpy if you are breastfeeding. This is temporary, and it is not a health risk. °INTIMACY AND SEXUALITY °Avoid sexual activity for at least 3-4 weeks after delivery or until the brownish-red vaginal flow is completely gone. If you want to avoid pregnancy, use some form of birth control. You can get pregnant after delivery, even if you have not had your period. °SEEK MEDICAL CARE IF: °· You feel unable to cope with the changes that a child brings to your life, and these feelings do not go away after several weeks. °· You notice a lump, a bump, or discharge on your breast. ° °SEEK IMMEDIATE MEDICAL CARE IF: °· Blood soaks your pad in 1 hour or less. °· You have: °? Severe pain or cramping in your lower abdomen. °? A bad-smelling vaginal discharge. °? A fever that is not controlled by medicine. °? A fever, and an area of your breast is red and sore. °? Pain or redness in your calf. °? Sudden, severe chest pain. °? Shortness of breath. °? Painful or bloody urination. °? Problems with your vision. °· You vomit for 12 hours or longer. °· You develop a severe headache. °· You have serious thoughts about hurting yourself, your child, or anyone else. ° °This information is not intended to replace advice given to you by your health care provider. Make sure you discuss any questions you have with your health care provider. °Document Released: 10/13/2000 Document Revised: 03/23/2016 Document Reviewed: 04/19/2015 °[employer]vier Interactive Patient Education © 2017 [employer] ° °

## 2017-04-20 NOTE — Lactation Note (Signed)
This note was copied from a baby's chart. Lactation Consultation Note  Patient Name: Denise [name] YNWGN'FToday's Date: 04/20/2017 Reason for consult: Follow-up assessment;Infant weight loss;Breast/nipple pain (4% weight loss, baby in the nursery for circ , mom engorged , ice ) 2nd LC visit to check on mom after icing .  LC assisted mom to set up the DEBP and mom pumped for 20 mins with 50 ml EBM Yield. LC also fixed the bottle for mom to feed due to her breast tissue being to sore and not a candidate  For a Nipple Shield. LC suggested to mom to keep her pumping up at least 8 times a day both breast for 15 - 20 mins.  Also to work on preventing sore nipples, and engorgement.  LC encouraged mom to call with questions  In regards to breast feeding .  Baby took 17 of EBM.  Mom may rent a DEBP, paperwork given to mom with instructions.   Maternal Data    Feeding Feeding Type: Breast Milk Nipple Type: Slow - flow  LATCH Score/Interventions                Intervention(s): Breastfeeding basics reviewed     Lactation Tools Discussed/Used Tools: Shells;Pump;Nipple Shields Nipple shield size: 16;20;Other (comment) (per mom  hasn't latched since yesterday ) Shell Type: Inverted Breast pump type: Double-Electric Breast Pump   Consult Status Consult Status: Follow-up Date: 04/20/17 Follow-up type: In-patient    Matilde SprangMargaret Ann Cylie Dor 04/20/2017, 2:53 PM

## 2017-04-20 NOTE — Discharge Summary (Signed)
OB Discharge Summary  Patient Name: Denise [name] DOB: [birth year] MRN: [id number]  Date of admission: 04/17/2017 Delivering MD: Michaele Offer   Date of discharge: 04/20/2017  Admitting diagnosis: 40WKS CONTRACTIONS LEAKING Intrauterine pregnancy: [redacted]w[redacted]d     Secondary diagnosis:Principal Problem:   PROM (premature rupture of membranes) Active Problems:   ADHD (attention deficit hyperactivity disorder)   Rh negative, antepartum   GBS (group B Streptococcus carrier), +RV culture, currently pregnant   History of herpes genitalis   Normal labor   Spontaneous vaginal delivery       Discharge diagnosis: Term Pregnancy Delivered                                                                      Augmentation: Pitocin  Complications: None  Hospital course:  Onset of Labor With Vaginal Delivery     26 y.o. yo G1P1001 at [redacted]w[redacted]d was admitted in Active Labor on 04/17/2017. Patient had an uncomplicated labor course as follows:  Membrane Rupture Time/Date: 4:00 AM ,04/17/2017   Intrapartum Procedures: Episiotomy: None [1]                                         Lacerations:  2nd degree [3]  Patient had a delivery of a Viable infant. 04/18/2017  Information for the patient's newborn:  Lousie, Calico [[id number]]  Delivery Method: Vaginal, Spontaneous Delivery (Filed from Delivery Summary)    Pateint had an uncomplicated postpartum course.  She is ambulating, tolerating a regular diet, passing flatus, and urinating well. Patient is discharged home in stable condition on 04/20/17.   Physical exam  Vitals:   04/18/17 2318 04/19/17 0300 04/19/17 1813 04/20/17 0528  BP: 126/76 113/63 129/84 116/78  Pulse: 94 80 92 78  Resp: 16 14 18 14   Temp: 99.5 F (37.5 C) 98 F (36.7 C) 98.6 F (37 C) 98.7 F (37.1 C)  TempSrc: Oral Oral Oral Oral  SpO2:      Weight:      Height:       General: alert Lochia: appropriate Uterine Fundus: firm Incision: N/A DVT  Evaluation: No evidence of DVT seen on physical exam. Labs: Lab Results  Component Value Date   WBC 9.3 04/17/2017   HGB 13.1 04/17/2017   HCT 37.3 04/17/2017   MCV 88.8 04/17/2017   PLT 214 04/17/2017   CMP Latest Ref Rng & Units 04/17/2017  Glucose 65 - 99 mg/dL 84  BUN 6 - 20 mg/dL 7  Creatinine 5.62 - 1.30 mg/dL 8.65  Sodium 784 - 696 mmol/L 135  Potassium 3.5 - 5.1 mmol/L 4.2  Chloride 101 - 111 mmol/L 107  CO2 22 - 32 mmol/L 18(L)  Calcium 8.9 - 10.3 mg/dL 9.1  Total Protein 6.5 - 8.1 g/dL 7.0  Total Bilirubin 0.3 - 1.2 mg/dL 0.4  Alkaline Phos 38 - 126 U/L 253(H)  AST 15 - 41 U/L 29  ALT 14 - 54 U/L 21    Discharge instruction: per After Visit Summary and "Baby and Me Booklet".  After Visit Meds:  Allergies as of 04/20/2017      Reactions  Cephalosporins Anaphylaxis, Hives      Medication List    STOP taking these medications   metoCLOPramide 10 MG tablet Commonly known as:  REGLAN   ondansetron 4 MG disintegrating tablet Commonly known as:  ZOFRAN ODT     TAKE these medications   famotidine 20 MG tablet Commonly known as:  PEPCID Take 1 tablet (20 mg total) by mouth 2 (two) times daily.   ibuprofen 600 MG tablet Commonly known as:  ADVIL,MOTRIN Take 1 tablet (600 mg total) by mouth every 6 (six) hours.   prenatal multivitamin Tabs tablet Take 1 tablet by mouth daily.   valACYclovir 500 MG tablet Commonly known as:  VALTREX Take 1 tablet (500 mg total) by mouth 2 (two) times daily.       Diet: routine diet  Activity: Advance as tolerated. Pelvic rest for 6 weeks.   Outpatient follow up:4 weeks Follow up Appt:No future appointments. Follow up visit: No Follow-up on file.  Postpartum contraception: Undecided  Newborn Data: Live born female  Birth Weight: 7 lb 0.7 oz (3195 g) APGAR: 8, 9  Baby Feeding: Breast Disposition:home with mother   04/20/2017 Allie BossierMyra C Rachelann Enloe, MD

## 2017-04-20 NOTE — Lactation Note (Signed)
This note was copied from a baby's chart. Lactation Consultation Note  Patient Name: Denise [name]: 04/20/2017 Reason for consult: Follow-up assessment;Infant weight loss;Breast/nipple pain (4% weight loss, baby in the nursery for circ , mom engorged , ice )  Baby is 39 hours old, and per mom hasn't latched the baby since yesterday , and breast are hurting.  LC assessed breast with moms permission, both nipples pinky red , non- compressible, and breast  Full in areas and firm to engorgement. LC fixed mom 3 reusable ice packs . And assisted mom to lay down and Apply ice. LC will check mom after she naps.     Maternal Data    Feeding    LATCH Score/Interventions                Intervention(s): Breastfeeding basics reviewed     Lactation Tools Discussed/Used Tools: Shells;Pump;Nipple Shields Nipple shield size: 16;20;Other (comment) (per mom  hasn't latched since yesterday ) Shell Type: Inverted Breast pump type: Double-Electric Breast Pump   Consult Status Consult Status: Follow-up [name]: 04/20/17 Follow-up type: In-patient    Matilde SprangMargaret Ann Dollene Mallery 04/20/2017, 11:44 AM

## 2017-04-20 NOTE — Lactation Note (Signed)
This note was copied from a baby's chart. Lactation Consultation Note  Patient Name: Denise [name] [id number]'XToday's Date: 04/20/2017 Reason for consult: Follow-up assessment;Infant weight loss;Breast/nipple pain (4% weight loss, baby in the nursery for circ , mom engorged , ice )  [facility].  Per mom decided to not rent the DEBP due to her cousin Having a DEBP - [employer].  Mom receptive to coming back for a LC O/P appt. Wednesday 6/27 at 11:30 am.  Appt. Reminder given to  Mom.  Mother informed of post-discharge support and given phone number to the lactation department, including services for phone call assistance; out-patient appointments; and breastfeeding support group. List of other breastfeeding resources in the community given in the handout. Encouraged mother to call for problems or concerns related to breastfeeding.    Maternal Data    Feeding Feeding Type: Breast Milk Nipple Type: Slow - flow  LATCH Score/Interventions                Intervention(s): Breastfeeding basics reviewed     Lactation Tools Discussed/Used Tools: Shells;Pump;Nipple Shields Nipple shield size: 16;20;Other (comment) (per mom  hasn't latched since yesterday ) Shell Type: Inverted Breast pump type: Double-Electric Breast Pump   Consult Status Consult Status: Follow-up Date: 04/20/17 Follow-up type: In-patient    Matilde SprangMargaret Ann Iwalani Templeton 04/20/2017, 2:48 PM

## 2017-04-21 LAB — TYPE AND SCREEN
ABO/RH(D): O NEG
ANTIBODY SCREEN: POSITIVE
DAT, IGG: NEGATIVE
UNIT DIVISION: 0
Unit division: 0

## 2017-04-21 LAB — BPAM RBC
Blood Product Expiration Date: 201807072359
Blood Product Expiration Date: 201807072359
UNIT TYPE AND RH: 9500
UNIT TYPE AND RH: 9500

## 2017-04-25 ENCOUNTER — Telehealth: Payer: Self-pay

## 2017-04-25 MED ORDER — SERTRALINE HCL 50 MG PO TABS: ORAL_TABLET | ORAL | 5 refills | Status: DC

## 2017-04-25 NOTE — Telephone Encounter (Signed)
TC from pt c/o crying spells, not eating, not happy, and just not feeling well since Saturday. SVD on 04/18/17. Denies thoughts of harming herself and other. Consulted with provider. [facility] Pt declines visit with [facility] and states she's had depression in the past, and medication has worked for her. Zoloft 50 mg recommended. Pt to take 1.5 tab po x 7 days then one tab daily. Pt agrees and voices understanding. Pt advised if she develops homicidal, suicidial, and/or ideation thoughts, to report to the ER STAT. Pt agrees and has no further questions.

## 2017-05-28 ENCOUNTER — Encounter: Payer: Self-pay | Admitting: Obstetrics and Gynecology

## 2017-05-28 ENCOUNTER — Encounter: Payer: Self-pay | Admitting: Obstetrics

## 2017-05-28 ENCOUNTER — Ambulatory Visit (INDEPENDENT_AMBULATORY_CARE_PROVIDER_SITE_OTHER): Payer: Medicaid Other | Admitting: Obstetrics and Gynecology

## 2017-05-28 DIAGNOSIS — Z1389 Encounter for screening for other disorder: Secondary | ICD-10-CM | POA: Diagnosis not present

## 2017-05-28 MED ORDER — ETONOGESTREL-ETHINYL ESTRADIOL 0.12-0.015 MG/24HR VA RING: VAGINAL_RING | VAGINAL | 12 refills | Status: DC

## 2017-05-28 NOTE — Progress Notes (Signed)
Subjective:     Denise [name] is a 26 y.o. female who presents for a postpartum visit. She is 6 weeks postpartum following a spontaneous vaginal delivery. I have fully reviewed the prenatal and intrapartum course. The delivery was at 39.4 gestational weeks. Outcome: spontaneous vaginal delivery. Anesthesia: epidural. Postpartum course has been uncomplicated. Baby's course has been uncomplicated. Baby is feeding by bottle - Similac Advance. Bleeding no bleeding. Bowel function is normal. Bladder function is normal. Patient is not sexually active. Contraception method is abstinence. Postpartum depression screening: negative.     Review of Systems Pertinent items are noted in HPI.   Objective:    There were no vitals taken for this visit.  General:  alert, cooperative and no distress   Breasts:  inspection negative, no nipple discharge or bleeding, no masses or nodularity palpable  Lungs: clear to auscultation bilaterally  Heart:  regular rate and rhythm  Abdomen: soft, non-tender; bowel sounds normal; no masses,  no organomegaly   Vulva:  normal  Vagina: normal vagina, no discharge, exudate, lesion, or erythema  Cervix:  multiparous appearance  Corpus: normal size, contour, position, consistency, mobility, non-tender  Adnexa:  normal adnexa and no mass, fullness, tenderness  Rectal Exam: Not performed.        Assessment:     Normal postpartum exam. Pap smear not done at today's visit.   Plan:    1. Contraception: NuvaRing vaginal inserts 2. Patient is medically cleared to resume all activities of daily living 3. Follow up in: 1 year or after November for annual exam or as needed.

## 2018-09-09 ENCOUNTER — Encounter: Payer: Self-pay | Admitting: Neurology

## 2018-09-09 ENCOUNTER — Ambulatory Visit: Payer: BLUE CROSS/BLUE SHIELD | Admitting: Neurology

## 2018-09-09 VITALS — BP 119/79 | HR 67 | Ht 63.5 in | Wt 152.0 lb

## 2018-09-09 DIAGNOSIS — H539 Unspecified visual disturbance: Secondary | ICD-10-CM

## 2018-09-09 DIAGNOSIS — R519 Headache, unspecified: Secondary | ICD-10-CM

## 2018-09-09 DIAGNOSIS — R51 Headache with orthostatic component, not elsewhere classified: Secondary | ICD-10-CM

## 2018-09-09 DIAGNOSIS — G43709 Chronic migraine without aura, not intractable, without status migrainosus: Secondary | ICD-10-CM

## 2018-09-09 MED ORDER — ONDANSETRON 4 MG PO TBDP: 4.0000 mg | ORAL_TABLET | Freq: Three times a day (TID) | ORAL | 3 refills | Status: DC | PRN

## 2018-09-09 MED ORDER — TOPIRAMATE ER 50 MG PO CAP24: 50.0000 mg | ORAL_CAPSULE | Freq: Every day | ORAL | 0 refills | Status: DC

## 2018-09-09 MED ORDER — RIZATRIPTAN BENZOATE 10 MG PO TBDP: 10.0000 mg | ORAL_TABLET | ORAL | 11 refills | Status: DC | PRN

## 2018-09-09 NOTE — Progress Notes (Signed)
[facility]    Provider:  Dr Lucia Gaskins Referring Provider: Sheliah Hatch, MD Primary Care Physician:  Sheliah Hatch, MD  CC:  Headache  HPI:  Denise [name] is a 27 y.o. female here as requested by Dr. Beverely Low for headaches. No significant history of headaches, never been diagnosed with a migraine disorder. 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 Her mother has migraines. She is sweating a lot more, blurry vision. Headaches worsening since having a baby 16 months ago but significantly worse last 6 moths. Behind the right eye, pulsating/pounding/throbbing, +photo/phonophobia. She has episodes of blurry vision/ double vision. She has daily headache daily for > 6 months. Worse with position, wake up with the headaches. >15 are migraine days. Laying in the dark helps. +vertigo. She sometimes gets an aura in the right vision really bright but not always. Not getting pregnant for 1-2 more years, using bith control reliably, discussed teratogenicty. Headaches are worse positionally and she can wake in the morning, worse with sneezing, vision changes. No other focal neurologic deficits, associated symptoms, inciting events or modifiable factors.  Reviewed notes, labs and imaging from outside physicians, which showed:  Reviewed notes from Dr. Beverely Low  patient was started on sertraline 50 mg for postpartum depression.  Patient says she is feeling better on the sertraline.  Patient had a natural delivery in 2018, since starting sertraline feels much better is able to laugh, feels energetic during the day.  No side effects.  She did discuss headaches however, but this was happening before the sertraline.  The frontal headache that feels like it is almost a migraine.  She is had no headache evaluation before. Patient says she is having daily headaches since delivery.   Review of Systems: Patient complains of symptoms per HPI as well as the following symptoms: headache,  depression, not feeling herself. Pertinent negatives and positives per HPI. All others negative.   Social History   Socioeconomic History  . Marital status: Single    Spouse name: Not on file  . Number of children: Not on file  . Years of education: Not on file  . Highest education level: Not on file  Occupational History  . Not on file  Social Needs  . Financial resource strain: Not on file  . Food insecurity:    Worry: Not on file    Inability: Not on file  . Transportation needs:    Medical: Not on file    Non-medical: Not on file  Tobacco Use  . Smoking status: Never Smoker  . Smokeless tobacco: Never Used  Substance and Sexual Activity  . Alcohol use: Yes    Comment: occasional/rare  . Drug use: No  . Sexual activity: Not Currently    Birth control/protection: None  Lifestyle  . Physical activity:    Days per week: Not on file    Minutes per session: Not on file  . Stress: Not on file  Relationships  . Social connections:    Talks on phone: Not on file    Gets together: Not on file    Attends religious service: Not on file    Active member of club or organization: Not on file    Attends meetings of clubs or organizations: Not on file    Relationship status: Not on file  . Intimate partner violence:    Fear of current or ex partner: Not on file    Emotionally abused: Not on file    Physically abused: Not  on file    Forced sexual activity: Not on file  Other Topics Concern  . Not on file  Social History Narrative  . Not on file    Family History  Problem Relation Age of Onset  . Multiple sclerosis Mother   . Cancer Mother   . Skin cancer Father   . Cancer Other   . Stroke Other   . Cancer Maternal Grandfather   . Stroke Paternal Grandmother   . Heart attack Paternal Grandfather     Past Medical History:  Diagnosis Date  . ADHD (attention deficit hyperactivity disorder)   . Anxiety     Past Surgical History:  Procedure Laterality Date  . NO  PAST SURGERIES      Current Outpatient Medications  Medication Sig Dispense Refill  . sertraline (ZOLOFT) 50 MG tablet Take 50 mg by mouth daily.  9  . ondansetron (ZOFRAN-ODT) 4 MG disintegrating tablet Take 1 tablet (4 mg total) by mouth every 8 (eight) hours as needed for nausea. 30 tablet 3  . rizatriptan (MAXALT-MLT) 10 MG disintegrating tablet Take 1 tablet (10 mg total) by mouth as needed for migraine. May repeat in 2 hours if needed 9 tablet 11  . Topiramate ER (TROKENDI XR) 50 MG CP24 Take 50 mg by mouth at bedtime. 14 capsule 0   No current facility-administered medications for this visit.     Allergies as of 09/09/2018 - Review Complete 09/09/2018  Allergen Reaction Noted  . Cephalosporins Anaphylaxis and Hives 10/13/2014    Vitals: BP 119/79   Pulse 67   Ht 5' 3.5" (1.613 m)   Wt 152 lb (68.9 kg)   BMI 26.50 kg/m  Last Weight:  Wt Readings from Last 1 Encounters:  09/09/18 152 lb (68.9 kg)   Last Height:   Ht Readings from Last 1 Encounters:  09/09/18 5' 3.5" (1.613 m)   Physical exam: Exam: Gen: NAD, conversant, well nourised, well groomed                     CV: RRR, no MRG. No Carotid Bruits. No peripheral edema, warm, nontender Eyes: Conjunctivae clear without exudates or hemorrhage  Neuro: Detailed Neurologic Exam  Speech:    Speech is normal; fluent and spontaneous with normal comprehension.  Cognition:    The patient is oriented to person, place, and time;     recent and remote memory intact;     language fluent;     normal attention, concentration,     fund of knowledge Cranial Nerves:    The pupils are equal, round, and reactive to light. The fundi are normal and spontaneous venous pulsations are present. Visual fields are full to finger confrontation. Extraocular movements are intact. Trigeminal sensation is intact and the muscles of mastication are normal. The face is symmetric. The palate elevates in the midline. Hearing intact. Voice is  normal. Shoulder shrug is normal. The tongue has normal motion without fasciculations.   Coordination:    Normal finger to nose and heel to shin. Normal rapid alternating movements.   Gait:    Heel-toe and tandem gait are normal.   Motor Observation:    No asymmetry, no atrophy, and no involuntary movements noted. Tone:    Normal muscle tone.    Posture:    Posture is normal. normal erect    Strength:    Strength is V/V in the upper and lower limbs.      Sensation: intact to LT  Reflex Exam:  DTR's:    Deep tendon reflexes in the upper and lower extremities are normal bilaterally.   Toes:    The toes are downgoing bilaterally.   Clonus:    Clonus is absent.       Assessment/Plan:  Patient with chornic migraines however given concerning symptoms needs thorough evaluation.   MRI brain due to concerning symptoms of morning headaches, positional headaches,vision changes  to look for space occupying mass, chiari or intracranial hypertension (pseudotumor).  Labwork todat  Acute management: Rizatritan. Please take one tablet at the onset of your headache. If it does not improve the symptoms please take one additional tablet. Do not take more then 2 tablets in 24hrs. Do not take use more then 2 to 3 times in a week.  Nausea and headache: Ondansetron  Preventative: Topiramate (samples): 25mg  at bedtime for one week then 50mg . Email me in a few weeks and we can decide whether to stay on 50mg  or to increase.  Discussed migraine management in detail, new migraineur, discussed cgrp, botox, daily oral preventatives  Meds ordered this encounter  Medications  . rizatriptan (MAXALT-MLT) 10 MG disintegrating tablet    Sig: Take 1 tablet (10 mg total) by mouth as needed for migraine. May repeat in 2 hours if needed    Dispense:  9 tablet    Refill:  11  . ondansetron (ZOFRAN-ODT) 4 MG disintegrating tablet    Sig: Take 1 tablet (4 mg total) by mouth every 8 (eight) hours as  needed for nausea.    Dispense:  30 tablet    Refill:  3  . Topiramate ER (TROKENDI XR) 50 MG CP24    Sig: Take 50 mg by mouth at bedtime.    Dispense:  14 capsule    Refill:  0    Samples trokendi 50mg [id number] 6/220 x 14 trokendi 25mg  [id number]     Orders Placed This Encounter  Procedures  . MR BRAIN W WO CONTRAST  . Comprehensive metabolic panel  . CBC  . TSH     To prevent or relieve headaches, try the following: Cool Compress. Lie down and place a cool compress on your head.  Avoid headache triggers. If certain foods or odors seem to have triggered your migraines in the past, avoid them. A headache diary might help you identify triggers.  Include physical activity in your daily routine. Try a daily walk or other moderate aerobic exercise.  Manage stress. Find healthy ways to cope with the stressors, such as delegating tasks on your to-do list.  Practice relaxation techniques. Try deep breathing, yoga, massage and visualization.  Eat regularly. Eating regularly scheduled meals and maintaining a healthy diet might help prevent headaches. Also, drink plenty of fluids.  Follow a regular sleep schedule. Sleep deprivation might contribute to headaches Consider biofeedback. With this mind-body technique, you learn to control certain bodily functions - such as muscle tension, heart rate and blood pressure - to prevent headaches or reduce headache pain.    Proceed to emergency room if you experience new or worsening symptoms or symptoms do not resolve, if you have new neurologic symptoms or if headache is severe, or for any concerning symptom.   Provided education and documentation from [employer] headache Society toolbox including articles on: chronic migraine medication overuse headache, chronic migraines, prevention of migraines, behavioral and other nonpharmacologic treatments for headache.   Cc: Dr. Lindon Romp, MD  [facility] [address]  [address] [place],  [place] [address]  Phone [phone] Fax [phone]

## 2018-09-09 NOTE — Patient Instructions (Addendum)
MRI brain  Labs Acute management: Rizatritan. Please take one tablet at the onset of your headache. If it does not improve the symptoms please take one additional tablet. Do not take more then 2 tablets in 24hrs. Do not take use more then 2 to 3 times in a week. Nausea and headache: Ondansetron Preventative: Topiramate (samples): 25mg  at bedtime for one week then 50mg . Email me in a few weeks and we can decide whether to stay on 50mg  or to increase.   Ondansetron oral dissolving tablet What is this medicine? ONDANSETRON (on DAN se tron) is used to treat nausea and vomiting caused by chemotherapy. It is also used to prevent or treat nausea and vomiting after surgery. This medicine may be used for other purposes; ask your health care provider or pharmacist if you have questions. COMMON BRAND NAME(S): Zofran ODT What should I tell my health care provider before I take this medicine? They need to know if you have any of these conditions: -heart disease -history of irregular heartbeat -liver disease -low levels of magnesium or potassium in the blood -an unusual or allergic reaction to ondansetron, granisetron, other medicines, foods, dyes, or preservatives -pregnant or trying to get pregnant -breast-feeding How should I use this medicine? These tablets are made to dissolve in the mouth. Do not try to push the tablet through the foil backing. With dry hands, peel away the foil backing and gently remove the tablet. Place the tablet in the mouth and allow it to dissolve, then swallow. While you may take these tablets with water, it is not necessary to do so. Talk to your pediatrician regarding the use of this medicine in children. Special care may be needed. Overdosage: If you think you have taken too much of this medicine contact a poison control center or emergency room at once. NOTE: This medicine is only for you. Do not share this medicine with others. What if I miss a dose? If you miss a  dose, take it as soon as you can. If it is almost time for your next dose, take only that dose. Do not take double or extra doses. What may interact with this medicine? Do not take this medicine with any of the following medications: -apomorphine -certain medicines for fungal infections like fluconazole, itraconazole, ketoconazole, posaconazole, voriconazole -cisapride -dofetilide -dronedarone -pimozide -thioridazine -ziprasidone This medicine may also interact with the following medications: -carbamazepine -certain medicines for depression, anxiety, or psychotic disturbances -fentanyl -linezolid -MAOIs like Carbex, Eldepryl, Marplan, Nardil, and Parnate -methylene blue (injected into a vein) -other medicines that prolong the QT interval (cause an abnormal heart rhythm) -phenytoin -rifampicin -tramadol This list may not describe all possible interactions. Give your health care provider a list of all the medicines, herbs, non-prescription drugs, or dietary supplements you use. Also tell them if you smoke, drink alcohol, or use illegal drugs. Some items may interact with your medicine. What should I watch for while using this medicine? Check with your doctor or health care professional as soon as you can if you have any sign of an allergic reaction. What side effects may I notice from receiving this medicine? Side effects that you should report to your doctor or health care professional as soon as possible: -allergic reactions like skin rash, itching or hives, swelling of the face, lips, or tongue -breathing problems -confusion -dizziness -fast or irregular heartbeat -feeling faint or lightheaded, falls -fever and chills -loss of balance or coordination -seizures -sweating -swelling of the hands and feet -tightness  in the chest -tremors -unusually weak or tired Side effects that usually do not require medical attention (report to your doctor or health care professional if they  continue or are bothersome): -constipation or diarrhea -headache This list may not describe all possible side effects. Call your doctor for medical advice about side effects. You may report side effects to FDA at 1-800-FDA-1088. Where should I keep my medicine? Keep out of the reach of children. Store between 2 and 30 degrees C (36 and 86 degrees F). Throw away any unused medicine after the expiration date. NOTE: This sheet is a summary. It may not cover all possible information. If you have questions about this medicine, talk to your doctor, pharmacist, or health care provider.  2018 Elsevier/Gold Standard (2013-07-23 16:21:52) Rizatriptan tablets What is this medicine? RIZATRIPTAN (rye za TRIP tan) is used to treat migraines with or without aura. An aura is a strange feeling or visual disturbance that warns you of an attack. It is not used to prevent migraines. This medicine may be used for other purposes; ask your health care provider or pharmacist if you have questions. COMMON BRAND NAME(S): Maxalt What should I tell my health care provider before I take this medicine? They need to know if you have any of these conditions: -bowel disease or colitis -diabetes -family history of heart disease -fast or irregular heart beat -heart or blood vessel disease, angina (chest pain), or previous heart attack -high blood pressure -high cholesterol -history of stroke, transient ischemic attacks (TIAs or mini-strokes), or intracranial bleeding -kidney or liver disease -overweight -poor circulation -postmenopausal or surgical removal of uterus and ovaries -Raynaud's disease -seizure disorder -an unusual or allergic reaction to rizatriptan, other medicines, foods, dyes, or preservatives -pregnant or trying to get pregnant -breast-feeding How should I use this medicine? This medicine is taken by mouth with a glass of water. Follow the directions on the prescription label. This medicine is taken  at the first symptoms of a migraine. It is not for everyday use. If your migraine headache returns after one dose, you can take another dose as directed. You must leave at least 2 hours between doses, and do not take more than 30 mg total in 24 hours. If there is no improvement at all after the first dose, do not take a second dose without talking to your doctor or health care professional. Do not take your medicine more often than directed. Talk to your pediatrician regarding the use of this medicine in children. While this drug may be prescribed for children as young as 6 years for selected conditions, precautions do apply. Overdosage: If you think you have taken too much of this medicine contact a poison control center or emergency room at once. NOTE: This medicine is only for you. Do not share this medicine with others. What if I miss a dose? This does not apply; this medicine is not for regular use. What may interact with this medicine? Do not take this medicine with any of the following medicines: -amphetamine, dextroamphetamine or cocaine -dihydroergotamine, ergotamine, ergoloid mesylates, methysergide, or ergot-type medication - do not take within 24 hours of taking rizatriptan -feverfew -MAOIs like Carbex, Eldepryl, Marplan, Nardil, and Parnate - do not take rizatriptan within 2 weeks of stopping MAOI therapy. -other migraine medicines like almotriptan, eletriptan, naratriptan, sumatriptan, zolmitriptan - do not take within 24 hours of taking rizatriptan -tryptophan This medicine may also interact with the following medications: -medicines for mental depression, anxiety or mood problems -propranolol This  list may not describe all possible interactions. Give your health care provider a list of all the medicines, herbs, non-prescription drugs, or dietary supplements you use. Also tell them if you smoke, drink alcohol, or use illegal drugs. Some items may interact with your medicine. What  should I watch for while using this medicine? Only take this medicine for a migraine headache. Take it if you get warning symptoms or at the start of a migraine attack. It is not for regular use to prevent migraine attacks. You may get drowsy or dizzy. Do not drive, use machinery, or do anything that needs mental alertness until you know how this medicine affects you. To reduce dizzy or fainting spells, do not sit or stand up quickly, especially if you are an older patient. Alcohol can increase drowsiness, dizziness and flushing. Avoid alcoholic drinks. Smoking cigarettes may increase the risk of heart-related side effects from using this medicine. If you take migraine medicines for 10 or more days a month, your migraines may get worse. Keep a diary of headache days and medicine use. Contact your healthcare professional if your migraine attacks occur more frequently. What side effects may I notice from receiving this medicine? Side effects that you should report to your doctor or health care professional as soon as possible: -allergic reactions like skin rash, itching or hives, swelling of the face, lips, or tongue -fast, slow, or irregular heart beat -increased or decreased blood pressure -seizures -severe stomach pain and cramping, bloody diarrhea -signs and symptoms of a blood clot such as breathing problems; changes in vision; chest pain; severe, sudden headache; pain, swelling, warmth in the leg; trouble speaking; sudden numbness or weakness of the face, arm or leg -tingling, pain, or numbness in the face, hands, or feet Side effects that usually do not require medical attention (report to your doctor or health care professional if they continue or are bothersome): -drowsiness -dry mouth -feeling warm, flushing, or redness of the face -headache -muscle cramps, pain -nausea, vomiting -unusually weak or tired This list may not describe all possible side effects. Call your doctor for medical  advice about side effects. You may report side effects to FDA at 1-800-FDA-1088. Where should I keep my medicine? Keep out of the reach of children. Store at room temperature between 15 and 30 degrees C (59 and 86 degrees F). Keep container tightly closed. Throw away any unused medicine after the expiration date. NOTE: This sheet is a summary. It may not cover all possible information. If you have questions about this medicine, talk to your doctor, pharmacist, or health care provider.  2018 Elsevier/Gold Standard (2013-06-17 10:16:39)  Topiramate extended-release capsules What is this medicine? TOPIRAMATE (toe PYRE a mate) is used to treat seizures in adults or children with epilepsy. It is also used for the prevention of migraine headaches. This medicine may be used for other purposes; ask your health care provider or pharmacist if you have questions. COMMON BRAND NAME(S): Trokendi XR What should I tell my health care provider before I take this medicine? They need to know if you have any of these conditions: -cirrhosis of the liver or liver disease -diarrhea -glaucoma -kidney stones or kidney disease -lung disease like asthma, obstructive pulmonary disease, emphysema -metabolic acidosis -on a ketogenic diet -scheduled for surgery or a procedure -suicidal thoughts, plans, or attempt; a previous suicide attempt by you or a family member -an unusual or allergic reaction to topiramate, other medicines, foods, dyes, or preservatives -pregnant or trying to get  pregnant -breast-feeding How should I use this medicine? Take this medicine by mouth with a glass of water. Follow the directions on the prescription label. Trokendi XR capsules must be swallowed whole. Do not sprinkle on food, break, crush, dissolve, or chew. Qudexy XR capsules may be swallowed whole or opened and sprinkled on a small amount of soft food. This mixture must be swallowed immediately. Do not chew or store mixture for  later use. You may take this medicine with meals. Take your medicine at regular intervals. Do not take it more often than directed. Talk to your pediatrician regarding the use of this medicine in children. Special care may be needed. While Trokendi XR may be prescribed for children as young as 6 years and Qudexy XR may be prescribed for children as young as 2 years for selected conditions, precautions do apply. Overdosage: If you think you have taken too much of this medicine contact a poison control center or emergency room at once. NOTE: This medicine is only for you. Do not share this medicine with others. What if I miss a dose? If you miss a dose, take it as soon as you can. If it is almost time for your next dose, take only that dose. Do not take double or extra doses. What may interact with this medicine? Do not take this medicine with any of the following medications: -probenecid This medicine may also interact with the following medications: -acetazolamide -alcohol -amitriptyline -birth control pills -digoxin -hydrochlorothiazide -lithium -medicines for pain, sleep, or muscle relaxation -metformin -methazolamide -other seizure or epilepsy medicines -pioglitazone -risperidone This list may not describe all possible interactions. Give your health care provider a list of all the medicines, herbs, non-prescription drugs, or dietary supplements you use. Also tell them if you smoke, drink alcohol, or use illegal drugs. Some items may interact with your medicine. What should I watch for while using this medicine? Visit your doctor or health care professional for regular checks on your progress. Do not stop taking this medicine suddenly. This increases the risk of seizures if you are using this medicine to control epilepsy. Wear a medical identification bracelet or chain to say you have epilepsy or seizures, and carry a card that lists all your medicines. This medicine can decrease sweating  and increase your body temperature. Watch for signs of deceased sweating or fever, especially in children. Avoid extreme heat, hot baths, and saunas. Be careful about exercising, especially in hot weather. Contact your health care provider right away if you notice a fever or decrease in sweating. You should drink plenty of fluids while taking this medicine. If you have had kidney stones in the past, this will help to reduce your chances of forming kidney stones. If you have stomach pain, with nausea or vomiting and yellowing of your eyes or skin, call your doctor immediately. You may get drowsy, dizzy, or have blurred vision. Do not drive, use machinery, or do anything that needs mental alertness until you know how this medicine affects you. To reduce dizziness, do not sit or stand up quickly, especially if you are an older patient. Alcohol can increase drowsiness and dizziness. Avoid alcoholic drinks. Do not drink alcohol for 6 hours before or 6 hours after taking Trokendi XR. If you notice blurred vision, eye pain, or other eye problems, seek medical attention at once for an eye exam. The use of this medicine may increase the chance of suicidal thoughts or actions. Pay special attention to how you are responding  while on this medicine. Any worsening of mood, or thoughts of suicide or dying should be reported to your health care professional right away. This medicine may increase the chance of developing metabolic acidosis. If left untreated, this can cause kidney stones, bone disease, or slowed growth in children. Symptoms include breathing fast, fatigue, loss of appetite, irregular heartbeat, or loss of consciousness. Call your doctor immediately if you experience any of these side effects. Also, tell your doctor about any surgery you plan on having while taking this medicine since this may increase your risk for metabolic acidosis. Birth control pills may not work properly while you are taking this  medicine. Talk to your doctor about using an extra method of birth control. Women who become pregnant while using this medicine may enroll in the [place] [employer] Antiepileptic Drug Pregnancy Registry by calling [phone]. This registry collects information about the safety of antiepileptic drug use during pregnancy. What side effects may I notice from receiving this medicine? Side effects that you should report to your doctor or health care professional as soon as possible: -allergic reactions like skin rash, itching or hives, swelling of the face, lips, or tongue -decreased sweating and/or rise in body temperature -depression -difficulty breathing, fast or irregular breathing patterns -difficulty speaking -difficulty walking or controlling muscle movements -hearing impairment -redness, blistering, peeling or loosening of the skin, including inside the mouth -tingling, pain or numbness in the hands or feet -unusually weak or tired -worsening of mood, thoughts or actions of suicide or dying Side effects that usually do not require medical attention (report to your doctor or health care professional if they continue or are bothersome): -altered taste -back pain, joint or muscle aches and pains -diarrhea, or constipation -headache -loss of appetite -nausea -stomach upset, indigestion -tremors This list may not describe all possible side effects. Call your doctor for medical advice about side effects. You may report side effects to FDA at 1-800-FDA-1088. Where should I keep my medicine? Keep out of the reach of children. Store at room temperature between 15 and 30 degrees C (59 and 86 degrees F) in a tightly closed container. Protect from moisture. Throw away any unused medicine after the expiration date. NOTE: This sheet is a summary. It may not cover all possible information. If you have questions about this medicine, talk to your doctor, pharmacist, or health care provider.  2018  Elsevier/Gold Standard (2016-02-04 12:33:11)

## 2018-09-10 ENCOUNTER — Encounter: Payer: Self-pay | Admitting: Neurology

## 2018-09-10 DIAGNOSIS — G43009 Migraine without aura, not intractable, without status migrainosus: Secondary | ICD-10-CM | POA: Insufficient documentation

## 2018-09-10 DIAGNOSIS — G43709 Chronic migraine without aura, not intractable, without status migrainosus: Secondary | ICD-10-CM | POA: Insufficient documentation

## 2018-09-10 LAB — COMPREHENSIVE METABOLIC PANEL
ALBUMIN: 4.3 g/dL (ref 3.5–5.5)
ALT: 14 IU/L (ref 0–32)
AST: 15 IU/L (ref 0–40)
Albumin/Globulin Ratio: 1.7 (ref 1.2–2.2)
Alkaline Phosphatase: 105 IU/L (ref 39–117)
BUN/Creatinine Ratio: 17 (ref 9–23)
BUN: 14 mg/dL (ref 6–20)
CHLORIDE: 106 mmol/L (ref 96–106)
CO2: 21 mmol/L (ref 20–29)
Calcium: 9.5 mg/dL (ref 8.7–10.2)
Creatinine, Ser: 0.83 mg/dL (ref 0.57–1.00)
GFR calc non Af Amer: 98 mL/min/{1.73_m2} (ref >59)
GFR, EST AFRICAN AMERICAN: 113 mL/min/{1.73_m2} (ref >59)
GLUCOSE: 69 mg/dL (ref 65–99)
Globulin, Total: 2.6 g/dL (ref 1.5–4.5)
Potassium: 4.4 mmol/L (ref 3.5–5.2)
Sodium: 142 mmol/L (ref 134–144)
TOTAL PROTEIN: 6.9 g/dL (ref 6.0–8.5)

## 2018-09-10 LAB — CBC
HEMATOCRIT: 36.4 % (ref 34.0–46.6)
HEMOGLOBIN: 11.9 g/dL (ref 11.1–15.9)
MCH: 27.3 pg (ref 26.6–33.0)
MCHC: 32.7 g/dL (ref 31.5–35.7)
MCV: 84 fL (ref 79–97)
Platelets: 326 10*3/uL (ref 150–450)
RBC: 4.36 x10E6/uL (ref 3.77–5.28)
RDW: 13.5 % (ref 12.3–15.4)
WBC: 6.4 10*3/uL (ref 3.4–10.8)

## 2018-09-10 LAB — TSH: TSH: 3.54 u[IU]/mL (ref 0.450–4.500)

## 2018-09-11 ENCOUNTER — Telehealth: Payer: Self-pay | Admitting: Neurology

## 2018-09-11 ENCOUNTER — Telehealth: Payer: Self-pay

## 2018-09-11 NOTE — Telephone Encounter (Signed)
I called pt and advised her of the normal lab work per Dr. Lucia GaskinsAhern. Pt verbalized understanding of results. Pt had no questions at this time but was encouraged to call back if questions arise.

## 2018-09-11 NOTE — Telephone Encounter (Signed)
unable to reach pt her mailbox vmail has not been set up  [employer]: [id number] (exp. 09/10/18 to 10/09/18)

## 2018-09-11 NOTE — Telephone Encounter (Signed)
-----   Message from Anson FretAntonia B Ahern, MD sent at 09/10/2018  7:36 PM EST ----- Labs normal thanks

## 2018-09-19 NOTE — Telephone Encounter (Signed)
[id number] unable to leave vmail mailbox not set up 

## 2018-12-10 ENCOUNTER — Ambulatory Visit: Payer: BLUE CROSS/BLUE SHIELD | Admitting: Neurology

## 2018-12-11 ENCOUNTER — Encounter: Payer: Self-pay | Admitting: Neurology

## 2018-12-29 ENCOUNTER — Inpatient Hospital Stay (HOSPITAL_COMMUNITY)
Admission: AD | Admit: 2018-12-29 | Discharge: 2018-12-29 | Disposition: A | Discharge status: Home / Self Care | Payer: BLUE CROSS/BLUE SHIELD | Source: Ambulatory Visit | Attending: Obstetrics & Gynecology | Admitting: Obstetrics & Gynecology

## 2018-12-29 ENCOUNTER — Encounter (HOSPITAL_COMMUNITY): Payer: Self-pay

## 2018-12-29 DIAGNOSIS — Z6741 Type O blood, Rh negative: Secondary | ICD-10-CM

## 2018-12-29 DIAGNOSIS — R102 Pelvic and perineal pain: Secondary | ICD-10-CM | POA: Diagnosis present

## 2018-12-29 DIAGNOSIS — Z3A12 12 weeks gestation of pregnancy: Secondary | ICD-10-CM | POA: Insufficient documentation

## 2018-12-29 DIAGNOSIS — B373 Candidiasis of vulva and vagina: Secondary | ICD-10-CM | POA: Insufficient documentation

## 2018-12-29 DIAGNOSIS — O98811 Other maternal infectious and parasitic diseases complicating pregnancy, first trimester: Secondary | ICD-10-CM | POA: Diagnosis not present

## 2018-12-29 DIAGNOSIS — O26851 Spotting complicating pregnancy, first trimester: Secondary | ICD-10-CM | POA: Diagnosis not present

## 2018-12-29 DIAGNOSIS — O360110 Maternal care for anti-D [Rh] antibodies, first trimester, not applicable or unspecified: Secondary | ICD-10-CM

## 2018-12-29 DIAGNOSIS — B3731 Acute candidiasis of vulva and vagina: Secondary | ICD-10-CM

## 2018-12-29 HISTORY — DX: Herpesviral infection of urogenital system, unspecified: A60.00

## 2018-12-29 LAB — URINALYSIS, ROUTINE W REFLEX MICROSCOPIC
Bilirubin Urine: NEGATIVE
Glucose, UA: NEGATIVE mg/dL
Hgb urine dipstick: NEGATIVE
KETONES UR: NEGATIVE mg/dL
Nitrite: NEGATIVE
Protein, ur: NEGATIVE mg/dL
Specific Gravity, Urine: 1.006 (ref 1.005–1.030)
pH: 7 (ref 5.0–8.0)

## 2018-12-29 LAB — POCT PREGNANCY, URINE: Preg Test, Ur: POSITIVE — AB

## 2018-12-29 LAB — CBC
HCT: 33.7 % — ABNORMAL LOW (ref 36.0–46.0)
Hemoglobin: 11.3 g/dL — ABNORMAL LOW (ref 12.0–15.0)
MCH: 27.2 pg (ref 26.0–34.0)
MCHC: 33.5 g/dL (ref 30.0–36.0)
MCV: 81.2 fL (ref 80.0–100.0)
Platelets: 226 10*3/uL (ref 150–400)
RBC: 4.15 MIL/uL (ref 3.87–5.11)
RDW: 13.7 % (ref 11.5–15.5)
WBC: 6.9 10*3/uL (ref 4.0–10.5)
nRBC: 0 % (ref 0.0–0.2)

## 2018-12-29 LAB — WET PREP, GENITAL
Clue Cells Wet Prep HPF POC: NONE SEEN
Sperm: NONE SEEN
Trich, Wet Prep: NONE SEEN

## 2018-12-29 MED ORDER — TERCONAZOLE 0.4 % VA CREA: 1.0000 | TOPICAL_CREAM | Freq: Every day | VAGINAL | 0 refills | Status: AC

## 2018-12-29 MED ORDER — RHO D IMMUNE GLOBULIN 1500 UNIT/2ML IJ SOSY
300.0000 ug | PREFILLED_SYRINGE | Freq: Once | INTRAMUSCULAR | Status: AC
  Administered 2018-12-29: 300 ug via INTRAMUSCULAR
  Filled 2018-12-29: qty 2

## 2018-12-29 NOTE — MAU Provider Note (Addendum)
History     CSN: [id number]  Arrival date and time: 12/29/18 1247   First Provider Initiated Contact with Patient 12/29/18 1403      Chief Complaint  Patient presents with  . Vaginal Bleeding  . Pelvic Pain  . Vaginal Discharge  . Vaginal Itching   Vaginal Bleeding  The patient's primary symptoms include genital itching, a genital odor, pelvic pain, vaginal bleeding and vaginal discharge. This is a new problem. The current episode started in the past 7 days. The problem occurs intermittently. The problem has been unchanged. Pain severity now: 3/10. The problem affects the left side. She is pregnant. Associated symptoms include nausea. Pertinent negatives include no chills, fever or vomiting. The vaginal discharge was thick and white. The vaginal bleeding is spotting. Nothing aggravates the symptoms. She has tried heating pads for the symptoms. The treatment provided significant relief. She is sexually active. She uses oral contraceptives (only took for about one month. Stopped approx 6 months ago. ) for contraception. Her menstrual history has been regular (LMP 10/02/19 ).    OB History    Gravida  2   Para  1   Term  1   Preterm  0   AB  0   Living  1     SAB  0   TAB  0   Ectopic  0   Multiple  0   Live Births  1           Past Medical History:  Diagnosis Date  . ADHD (attention deficit hyperactivity disorder)   . Anxiety   . Genital herpes     Past Surgical History:  Procedure Laterality Date  . NO PAST SURGERIES      Family History  Problem Relation Age of Onset  . Multiple sclerosis Mother   . Cancer Mother   . Skin cancer Father   . Cancer Other   . Stroke Other   . Cancer Maternal Grandfather   . Stroke Paternal Grandmother   . Heart attack Paternal Grandfather     Social History   Tobacco Use  . Smoking status: Never Smoker  . Smokeless tobacco: Never Used  Substance Use Topics  . Alcohol use: Not Currently    Comment:  occasional/rare  . Drug use: No    Allergies:  Allergies  Allergen Reactions  . Cephalosporins Anaphylaxis and Hives    Medications Prior to Admission  Medication Sig Dispense Refill Last Dose  . ondansetron (ZOFRAN-ODT) 4 MG disintegrating tablet Take 1 tablet (4 mg total) by mouth every 8 (eight) hours as needed for nausea. 30 tablet 3   . rizatriptan (MAXALT-MLT) 10 MG disintegrating tablet Take 1 tablet (10 mg total) by mouth as needed for migraine. May repeat in 2 hours if needed 9 tablet 11   . sertraline (ZOLOFT) 50 MG tablet Take 50 mg by mouth daily.  9 Taking  . Topiramate ER (TROKENDI XR) 50 MG CP24 Take 50 mg by mouth at bedtime. 14 capsule 0     Review of Systems  Constitutional: Negative for chills and fever.  Gastrointestinal: Positive for nausea. Negative for vomiting.  Genitourinary: Positive for pelvic pain, vaginal bleeding and vaginal discharge.   Physical Exam   Blood pressure 124/68, pulse 77, temperature 98.6 F (37 C), temperature source Oral, resp. rate 18, height 5' 3.5" (1.613 m), weight 64.3 kg, last menstrual period 10/01/2018, SpO2 99 %, unknown if currently breastfeeding.  Physical Exam  Nursing note and vitals reviewed.  Constitutional: She is oriented to person, place, and time. She appears well-developed and well-nourished. No distress.  HENT:  Head: Normocephalic.  Cardiovascular: Normal rate.  Respiratory: Effort normal.  GI: Soft. There is no abdominal tenderness.  Genitourinary:    Genitourinary Comments: FHT 168 with doppler    Neurological: She is alert and oriented to person, place, and time.  Skin: Skin is warm and dry.  Psychiatric: She has a normal mood and affect.    Results for orders placed or performed during the hospital encounter of 12/29/18 (from the past 24 hour(s))  Urinalysis, Routine w reflex microscopic     Status: Abnormal   Collection Time: 12/29/18  1:19 PM  Result Value Ref Range   Color, Urine STRAW (A) YELLOW    APPearance CLEAR CLEAR   Specific Gravity, Urine 1.006 1.005 - 1.030   pH 7.0 5.0 - 8.0   Glucose, UA NEGATIVE NEGATIVE mg/dL   Hgb urine dipstick NEGATIVE NEGATIVE   Bilirubin Urine NEGATIVE NEGATIVE   Ketones, ur NEGATIVE NEGATIVE mg/dL   Protein, ur NEGATIVE NEGATIVE mg/dL   Nitrite NEGATIVE NEGATIVE   Leukocytes,Ua LARGE (A) NEGATIVE   RBC / HPF 0-5 0 - 5 RBC/hpf   WBC, UA 0-5 0 - 5 WBC/hpf   Bacteria, UA RARE (A) NONE SEEN   Squamous Epithelial / LPF 0-5 0 - 5   Mucus PRESENT   Pregnancy, urine POC     Status: Abnormal   Collection Time: 12/29/18  1:25 PM  Result Value Ref Range   Preg Test, Ur POSITIVE (A) NEGATIVE  Wet prep, genital     Status: Abnormal   Collection Time: 12/29/18  1:46 PM  Result Value Ref Range   Yeast Wet Prep HPF POC PRESENT (A) NONE SEEN   Trich, Wet Prep NONE SEEN NONE SEEN   Clue Cells Wet Prep HPF POC NONE SEEN NONE SEEN   WBC, Wet Prep HPF POC MANY (A) NONE SEEN   Sperm NONE SEEN   CBC     Status: Abnormal   Collection Time: 12/29/18  2:36 PM  Result Value Ref Range   WBC 6.9 4.0 - 10.5 K/uL   RBC 4.15 3.87 - 5.11 MIL/uL   Hemoglobin 11.3 (L) 12.0 - 15.0 g/dL   HCT 96.7 (L) 59.1 - 63.8 %   MCV 81.2 80.0 - 100.0 fL   MCH 27.2 26.0 - 34.0 pg   MCHC 33.5 30.0 - 36.0 g/dL   RDW 46.6 59.9 - 35.7 %   Platelets 226 150 - 400 K/uL   nRBC 0.0 0.0 - 0.2 %  Rh IG workup (includes ABO/Rh)     Status: None (Preliminary result)   Collection Time: 12/29/18  2:58 PM  Result Value Ref Range   Gestational Age(Wks)      12 Performed at [facility], [address] [place], [place] [address]    ABO/RH(D) PENDING    MAU Course  Procedures  MDM O Negative, rhogam given here in MAU today   Assessment and Plan   1. Yeast infection involving the vagina and surrounding area   2. Type O blood, Rh negative   3. Spotting affecting pregnancy in first trimester   4. [redacted] weeks gestation of pregnancy    DC home Comfort measures reviewed  1st  Trimester precautions  Bleeding precautions RX: terazol 7 as directed  Return to MAU as needed FU with OB as planned    Thressa Sheller DNP, CNM  12/29/18  3:25 PM

## 2018-12-29 NOTE — Discharge Instructions (Signed)
Vaginal Yeast infection, Adult    Vaginal yeast infection is a condition that causes vaginal discharge as well as soreness, swelling, and redness (inflammation) of the vagina. This is a common condition. Some women get this infection frequently.  What are the causes?  This condition is caused by a change in the normal balance of the yeast (candida) and bacteria that live in the vagina. This change causes an overgrowth of yeast, which causes the inflammation.  What increases the risk?  The condition is more likely to develop in women who:   Take antibiotic medicines.   Have diabetes.   Take birth control pills.   Are pregnant.   Douche often.   Have a weak body defense system (immune system).   Have been taking steroid medicines for a long time.   Frequently wear tight clothing.  What are the signs or symptoms?  Symptoms of this condition include:   White, thick, creamy vaginal discharge.   Swelling, itching, redness, and irritation of the vagina. The lips of the vagina (vulva) may be affected as well.   Pain or a burning feeling while urinating.   Pain during sex.  How is this diagnosed?  This condition is diagnosed based on:   Your medical history.   A physical exam.   A pelvic exam. Your health care provider will examine a sample of your vaginal discharge under a microscope. Your health care provider may send this sample for testing to confirm the diagnosis.  How is this treated?  This condition is treated with medicine. Medicines may be over-the-counter or prescription. You may be told to use one or more of the following:   Medicine that is taken by mouth (orally).   Medicine that is applied as a cream (topically).   Medicine that is inserted directly into the vagina (suppository).  Follow these instructions at home:    Lifestyle   Do not have sex until your health care provider approves. Tell your sex partner that you have a yeast infection. That person should go to his or her health care  provider and ask if they should also be treated.   Do not wear tight clothes, such as pantyhose or tight pants.   Wear breathable cotton underwear.  General instructions   Take or apply over-the-counter and prescription medicines only as told by your health care provider.   Eat more yogurt. This may help to keep your yeast infection from returning.   Do not use tampons until your health care provider approves.   Try taking a sitz bath to help with discomfort. This is a warm water bath that is taken while you are sitting down. The water should only come up to your hips and should cover your buttocks. Do this 3-4 times per day or as told by your health care provider.   Do not douche.   If you have diabetes, keep your blood sugar levels under control.   Keep all follow-up visits as told by your health care provider. This is important.  Contact a health care provider if:   You have a fever.   Your symptoms go away and then return.   Your symptoms do not get better with treatment.   Your symptoms get worse.   You have new symptoms.   You develop blisters in or around your vagina.   You have blood coming from your vagina and it is not your menstrual period.   You develop pain in your abdomen.  Summary     Vaginal yeast infection is a condition that causes discharge as well as soreness, swelling, and redness (inflammation) of the vagina.   This condition is treated with medicine. Medicines may be over-the-counter or prescription.   Take or apply over-the-counter and prescription medicines only as told by your health care provider.   Do not douche. Do not have sex or use tampons until your health care provider approves.   Contact a health care provider if your symptoms do not get better with treatment or your symptoms go away and then return.  This information is not intended to replace advice given to you by your health care provider. Make sure you discuss any questions you have with your health care  provider.  Document Released: 07/26/2005 Document Revised: 03/04/2018 Document Reviewed: 03/04/2018  Elsevier Interactive Patient Education  2019 [employer]

## 2018-12-29 NOTE — MAU Note (Signed)
Denise [name] is a 28 y.o. here in MAU reporting: started having cramping pain in her back and pelvic pain. Saw some blood on the toilet paper this morning. States she is having vaginal itching and some foul smelling discharge   LMP: 10/01/18  Onset of complaint: yesterday  Pain score: 3-10  Vitals:   12/29/18 1316  BP: 124/68  Pulse: 77  Resp: 18  Temp: 98.6 F (37 C)  SpO2: 99%      Lab orders placed from triage: UA, UPT

## 2018-12-30 LAB — RH IG WORKUP (INCLUDES ABO/RH)
ABO/RH(D): O NEG
Antibody Screen: NEGATIVE
Gestational Age(Wks): 12
Unit division: 0

## 2018-12-30 LAB — GC/CHLAMYDIA PROBE AMP (~~LOC~~) NOT AT ARMC
Chlamydia: NEGATIVE
Neisseria Gonorrhea: NEGATIVE

## 2018-12-30 LAB — HM HEPATITIS C SCREENING LAB: HM Hepatitis Screen: NEGATIVE

## 2019-07-01 MED ORDER — LACTATED RINGERS IV SOLN
INTRAVENOUS | Status: DC
Start: ? — End: 2019-07-01

## 2019-07-01 MED ORDER — LIDOCAINE HCL (PF) 1 % IJ SOLN
1.00 | INTRAMUSCULAR | Status: DC
Start: ? — End: 2019-07-01

## 2019-07-01 MED ORDER — NALBUPHINE HCL 10 MG/ML IJ SOLN
2.50 | INTRAMUSCULAR | Status: DC
Start: ? — End: 2019-07-01

## 2019-07-01 MED ORDER — TUMS LASTING EFFECTS PO
30.00 | ORAL | Status: DC
Start: ? — End: 2019-07-01

## 2019-07-01 MED ORDER — QUINERVA 260 MG PO TABS
650.00 | ORAL_TABLET | ORAL | Status: DC
Start: ? — End: 2019-07-01

## 2019-07-01 MED ORDER — BUTORPHANOL TARTRATE 1 MG/ML IJ SOLN
1.00 | INTRAMUSCULAR | Status: DC
Start: ? — End: 2019-07-01

## 2019-07-01 MED ORDER — HYDROCODONE-ACETAMINOPHEN 5-325 MG PO TABS
1.00 | ORAL_TABLET | ORAL | Status: DC
Start: ? — End: 2019-07-01

## 2019-07-01 MED ORDER — OXYCODONE-ACETAMINOPHEN 5-325 MG PO TABS
1.00 | ORAL_TABLET | ORAL | Status: DC
Start: ? — End: 2019-07-01

## 2019-07-01 MED ORDER — EQUATE NICOTINE 4 MG MT GUM
4.00 | CHEWING_GUM | OROMUCOSAL | Status: DC
Start: ? — End: 2019-07-01

## 2019-07-01 MED ORDER — PROMETHAZINE HCL (BULK CHEMICALS - P'S)
25.00 | Status: DC
Start: ? — End: 2019-07-01

## 2019-07-01 MED ORDER — BISACODYL 10 MG RE SUPP
10.00 | RECTAL | Status: DC
Start: ? — End: 2019-07-01

## 2019-07-01 MED ORDER — OXYTOCIN-SODIUM CHLORIDE 30-0.9 UNIT/L-% IV SOLN
0.50 | INTRAVENOUS | Status: DC
Start: ? — End: 2019-07-01

## 2019-07-01 MED ORDER — FENTANYL-BUPIVACAINE-NACL 0.2-0.125-0.9 MG/100ML-% EP SOLN
EPIDURAL | Status: DC
Start: ? — End: 2019-07-01

## 2019-07-01 MED ORDER — GENERIC EXTERNAL MEDICATION
Status: DC
Start: ? — End: 2019-07-01

## 2019-07-01 MED ORDER — IBUPROFEN 400 MG PO TABS
800.00 | ORAL_TABLET | ORAL | Status: DC
Start: 2019-07-01 — End: 2019-07-01

## 2019-07-01 MED ORDER — BENZOCAINE-MENTHOL 20-0.5 % EX AERO
1.00 | INHALATION_SPRAY | CUTANEOUS | Status: DC
Start: ? — End: 2019-07-01

## 2019-07-01 MED ORDER — GRANDPAS INDIAN CORN SOAP EX
500.00 | CUTANEOUS | Status: DC
Start: 2019-07-02 — End: 2019-07-01

## 2019-07-01 MED ORDER — ONDANSETRON HCL 4 MG/2ML IJ SOLN
4.00 | INTRAMUSCULAR | Status: DC
Start: ? — End: 2019-07-01

## 2019-07-01 MED ORDER — NALOXONE HCL 0.4 MG/ML IJ SOLN
0.10 | INTRAMUSCULAR | Status: DC
Start: ? — End: 2019-07-01

## 2019-07-01 MED ORDER — MUCUS & COUGH RELIEF CHILDRENS 5-100 MG/5ML PO LIQD
0.50 | ORAL | Status: DC
Start: ? — End: 2019-07-01

## 2019-07-01 MED ORDER — GATTEX 5 MG ~~LOC~~ KIT
5.00 | PACK | SUBCUTANEOUS | Status: DC
Start: ? — End: 2019-07-01

## 2019-07-01 MED ORDER — Medication
30.00 | Status: DC
Start: ? — End: 2019-07-01

## 2019-07-01 MED ORDER — Medication
Status: DC
Start: ? — End: 2019-07-01

## 2019-08-12 DIAGNOSIS — F53 Postpartum depression: Secondary | ICD-10-CM | POA: Insufficient documentation

## 2019-08-27 ENCOUNTER — Other Ambulatory Visit: Payer: Self-pay

## 2019-08-27 DIAGNOSIS — Z20822 Contact with and (suspected) exposure to covid-19: Secondary | ICD-10-CM

## 2019-08-28 LAB — NOVEL CORONAVIRUS, NAA

## 2019-09-01 ENCOUNTER — Telehealth: Payer: Self-pay | Admitting: General Practice

## 2019-09-01 ENCOUNTER — Telehealth: Payer: Self-pay

## 2019-09-01 NOTE — Telephone Encounter (Signed)
Phone call to pt.  Advised that the COVID test on 10/28 was indeterminate, and recommended to consider retesting.  The pt. Stated she planned to retest tomorrow.  Also she shared that 2 of her children have tested positive.  The pt. stated she feels fine; denied any symptoms.  Advised it is recommended that she should quarantine for 14 days, due to direct exposure to COVID from her children.  Verb. Understanding.

## 2019-09-30 ENCOUNTER — Encounter (HOSPITAL_COMMUNITY): Payer: Self-pay

## 2019-12-04 ENCOUNTER — Other Ambulatory Visit: Payer: Self-pay

## 2019-12-04 ENCOUNTER — Emergency Department (HOSPITAL_COMMUNITY)
Admission: EM | Admit: 2019-12-04 | Discharge: 2019-12-04 | Disposition: A | Discharge status: Home / Self Care | Payer: BLUE CROSS/BLUE SHIELD | Attending: Emergency Medicine | Admitting: Emergency Medicine

## 2019-12-04 ENCOUNTER — Emergency Department (HOSPITAL_COMMUNITY): Payer: BLUE CROSS/BLUE SHIELD

## 2019-12-04 ENCOUNTER — Encounter (HOSPITAL_COMMUNITY): Payer: Self-pay | Admitting: Emergency Medicine

## 2019-12-04 DIAGNOSIS — Y929 Unspecified place or not applicable: Secondary | ICD-10-CM | POA: Diagnosis not present

## 2019-12-04 DIAGNOSIS — S61512A Laceration without foreign body of left wrist, initial encounter: Secondary | ICD-10-CM | POA: Diagnosis not present

## 2019-12-04 DIAGNOSIS — Z79899 Other long term (current) drug therapy: Secondary | ICD-10-CM | POA: Diagnosis not present

## 2019-12-04 DIAGNOSIS — S61209A Unspecified open wound of unspecified finger without damage to nail, initial encounter: Secondary | ICD-10-CM | POA: Insufficient documentation

## 2019-12-04 DIAGNOSIS — Y9389 Activity, other specified: Secondary | ICD-10-CM | POA: Insufficient documentation

## 2019-12-04 DIAGNOSIS — Y999 Unspecified external cause status: Secondary | ICD-10-CM | POA: Insufficient documentation

## 2019-12-04 DIAGNOSIS — W25XXXA Contact with sharp glass, initial encounter: Secondary | ICD-10-CM | POA: Diagnosis not present

## 2019-12-04 MED ORDER — LIDOCAINE-EPINEPHRINE (PF) 2 %-1:200000 IJ SOLN
10.0000 mL | Freq: Once | INTRAMUSCULAR | Status: AC
  Administered 2019-12-04: 10 mL
  Filled 2019-12-04: qty 20

## 2019-12-04 MED ORDER — BACITRACIN ZINC 500 UNIT/GM EX OINT: TOPICAL_OINTMENT | Freq: Two times a day (BID) | CUTANEOUS | Status: DC

## 2019-12-04 MED ORDER — HYDROCODONE-ACETAMINOPHEN 5-325 MG PO TABS
1.0000 | ORAL_TABLET | Freq: Once | ORAL | Status: AC
  Administered 2019-12-04: 1 via ORAL
  Filled 2019-12-04: qty 1

## 2019-12-04 NOTE — ED Provider Notes (Signed)
[facility] EMERGENCY DEPARTMENT Provider Note   CSN: [id number] Arrival date & time: 12/04/19  1256     History Chief Complaint  Patient presents with  . Extremity Laceration    Denise [name] is a 29 y.o. female presenting for evaluation of left wrist laceration.  Patient states that prior to arrival she was pushing a glass window pain and when it shattered.  She has cuts of her left wrist and hand.  She denies injury elsewhere.  Her tetanus is utd.  She is not on blood thinners.  Denies numbness or tingling.  She has not taken anything for pain.  Pain is constant, worse with palpation and movement.  Does not radiate.  HPI     Past Medical History:  Diagnosis Date  . ADHD (attention deficit hyperactivity disorder)   . Anxiety   . Genital herpes     Patient Active Problem List   Diagnosis Date Noted  . Chronic migraine without aura without status migrainosus, not intractable 09/10/2018  . PROM (premature rupture of membranes) 04/19/2017  . Spontaneous vaginal delivery 04/19/2017  . Normal labor 04/17/2017  . GBS (group B Streptococcus carrier), +RV culture, currently pregnant 03/29/2017  . History of herpes genitalis 03/29/2017  . Rh negative, antepartum 01/16/2017  . Supervision of normal pregnancy, antepartum 11/21/2016  . Insomnia 04/19/2015  . ADHD (attention deficit hyperactivity disorder) 01/04/2015  . Pityriasis rosea 01/04/2015    Past Surgical History:  Procedure Laterality Date  . NO PAST SURGERIES       OB History    Gravida  2   Para  1   Term  1   Preterm  0   AB  0   Living  1     SAB  0   TAB  0   Ectopic  0   Multiple  0   Live Births  1           Family History  Problem Relation Age of Onset  . Multiple sclerosis Mother   . Cancer Mother   . Skin cancer Father   . Cancer Other   . Stroke Other   . Cancer Maternal Grandfather   . Stroke Paternal Grandmother   . Heart attack Paternal Grandfather      Social History   Tobacco Use  . Smoking status: Never Smoker  . Smokeless tobacco: Never Used  Substance Use Topics  . Alcohol use: Not Currently    Comment: occasional/rare  . Drug use: No    Home Medications Prior to Admission medications   Medication Sig Start Date End Date Taking? Authorizing Provider  ondansetron (ZOFRAN-ODT) 4 MG disintegrating tablet Take 1 tablet (4 mg total) by mouth every 8 (eight) hours as needed for nausea. 09/09/18   Anson Fret, MD  rizatriptan (MAXALT-MLT) 10 MG disintegrating tablet Take 1 tablet (10 mg total) by mouth as needed for migraine. May repeat in 2 hours if needed 09/09/18   Anson Fret, MD  sertraline (ZOLOFT) 50 MG tablet Take 50 mg by mouth daily. 08/21/18   [provider]  Topiramate ER (TROKENDI XR) 50 MG CP24 Take 50 mg by mouth at bedtime. 09/09/18   Anson Fret, MD    Allergies    Cephalosporins  Review of Systems   Review of Systems  Skin: Positive for wound.  Hematological: Does not bruise/bleed easily.    Physical Exam Updated Vital Signs BP 126/71 (BP Location: Right Arm)  Pulse 84   Temp 99.1 F (37.3 C) (Oral)   Resp 16   SpO2 100%   Physical Exam Vitals and nursing note reviewed.  Constitutional:      General: She is not in acute distress.    Appearance: She is well-developed.     Comments: Appears nontoxic  HENT:     Head: Normocephalic and atraumatic.  Pulmonary:     Effort: Pulmonary effort is normal.  Abdominal:     General: There is no distension.  Musculoskeletal:        General: Signs of injury present. Normal range of motion.     Cervical back: Normal range of motion.     Comments: 2 cm curved laceration of the ulnar aspect of the left wrist without active bleeding. 0.5 cm diameter avulsion of the skin of the ulnar left pinky over the PIP joint.  Minimal bleeding. Full active range of motion of the wrist without difficulty.  Radial pulses 2+ bilaterally.  Grip  strength intact bilaterally.  Full active range of motion of all fingers including pinky to thumb, thumbs up, and crossing fingers.  Skin:    General: Skin is warm.     Capillary Refill: Capillary refill takes less than 2 seconds.     Findings: No rash.  Neurological:     Mental Status: She is alert and oriented to person, place, and time.     ED Results / Procedures / Treatments   Labs (all labs ordered are listed, but only abnormal results are displayed) Labs Reviewed - No data to display  EKG None  Radiology DG Wrist Complete Left  Result Date: 12/04/2019 CLINICAL DATA:  Laceration, evaluate for foreign body EXAM: LEFT WRIST - COMPLETE 3+ VIEW COMPARISON:  None. FINDINGS: There is no evidence of fracture or dislocation. Incidental note of a 3 mm focus of sclerosis within the distal radius, likely a benign bone island. There is no evidence of arthropathy or other focal bone abnormality. Soft tissues are unremarkable. No soft tissue air or radiopaque foreign body. IMPRESSION: No acute fracture or radiopaque foreign body. Electronically Signed   By: Duanne Guess D.O.   On: 12/04/2019 14:38   DG Hand Complete Left  Result Date: 12/04/2019 CLINICAL DATA:  Laceration today from glass in the lateral left hand/left fifth finger. EXAM: LEFT HAND - COMPLETE 3+ VIEW COMPARISON:  None. FINDINGS: No fracture or dislocation. No suspicious focal osseous lesions. No osseous erosions or periosteal reaction. No significant arthropathy. No radiopaque foreign body. IMPRESSION: No acute osseous abnormality.  No radiopaque foreign body. Electronically Signed   By: Delbert Phenix M.D.   On: 12/04/2019 14:38    Procedures .Marland KitchenLaceration Repair  Date/Time: 12/04/2019 4:50 PM Performed by: Alveria Apley, PA-C Authorized by: Alveria Apley, PA-C   Consent:    Consent obtained:  Verbal   Consent given by:  Patient   Risks discussed:  Infection, need for additional repair, nerve damage, poor wound  healing, poor cosmetic result, pain, retained foreign body, tendon damage and vascular damage Anesthesia (see MAR for exact dosages):    Anesthesia method:  Local infiltration   Local anesthetic:  Lidocaine 2% WITH epi Laceration details:    Location:  Hand   Hand location:  L wrist   Length (cm):  2   Depth (mm):  3 Repair type:    Repair type:  Simple Pre-procedure details:    Preparation:  Patient was prepped and draped in usual sterile fashion and imaging obtained to  evaluate for foreign bodies Exploration:    Hemostasis achieved with:  Direct pressure   Wound exploration: wound explored through full range of motion     Wound extent: no muscle damage noted, no nerve damage noted and no underlying fracture noted   Treatment:    Area cleansed with:  Saline   Amount of cleaning:  Standard Skin repair:    Repair method:  Sutures   Suture size:  4-0   Suture material:  Prolene   Suture technique:  Simple interrupted   Number of sutures:  4 Approximation:    Approximation:  Close Post-procedure details:    Dressing:  Open (no dressing)   Patient tolerance of procedure:  Tolerated well, no immediate complications   (including critical care time)  Medications Ordered in ED Medications  bacitracin ointment (has no administration in time range)  HYDROcodone-acetaminophen (NORCO/VICODIN) 5-325 MG per tablet 1 tablet (1 tablet Oral Given 12/04/19 1414)  lidocaine-EPINEPHrine (XYLOCAINE W/EPI) 2 %-1:200000 (PF) injection 10 mL (10 mLs Infiltration Given by Other 12/04/19 1417)    ED Course  I have reviewed the triage vital signs and the nursing notes.  Pertinent labs & imaging results that were available during my care of the patient were reviewed by me and considered in my medical decision making (see chart for details).    MDM Rules/Calculators/A&P                      Patient presenting for evaluation of left hand and wrist laceration.  Physical exam shows patient who is  neurovascularly intact.  Xray viewed and interpreted by me, no obvious fbo or bony injury. Laceration of the left wrist requiring sutures.  Avulsion of the left pinky is not amenable to sutures.  Discussed wound care.  Laceration repaired as described above.  Aftercare instructions given.  At this time, patient appears safe for discharge.  Return precautions given.  Patient states she understands and agrees to plan.  Final Clinical Impression(s) / ED Diagnoses Final diagnoses:  Laceration of left wrist, initial encounter  Open wound of finger, initial encounter    Rx / DC Orders ED Discharge Orders    None       Franchot Heidelberg, PA-C 12/04/19 1652    Tegeler, Gwenyth Allegra, MD 12/04/19 [phone]

## 2019-12-04 NOTE — ED Triage Notes (Signed)
Pt arrives to ED from home with complaints of multiple lacerations to her left hand after pushing a glass door back into place that shattered cutting her hand.

## 2019-12-04 NOTE — Discharge Instructions (Signed)
1. Medications: Tylenol or ibuprofen for pain, usual home medications 2. Treatment: ice for swelling, keep wound clean with warm soap and water and keep bandage dry 3. Follow Up: Please return in 7 days to have your stitches removed or sooner if you have concerns. Return to the emergency department for increased redness, drainage of pus from the wound   WOUND CARE  Remove bandage and wash wound gently with mild soap and warm water. Reapply a new bandage after cleaning wound.   Continue daily cleansing with soap and water until stitches are removed.  Do not apply any ointments or creams to the wound while stitches are in place, as this may cause delayed healing. Return if you experience any of the following signs of infection: Swelling, redness, pus drainage, streaking, fever >101.0 F  Return if you experience excessive bleeding that does not stop after 15-20 minutes of constant, firm pressure.

## 2019-12-04 NOTE — ED Notes (Signed)
Patient Alert and oriented to baseline. Stable and ambulatory to baseline. Patient verbalized understanding of the discharge instructions.  Patient belongings were taken by the patient.   

## 2019-12-11 ENCOUNTER — Ambulatory Visit (HOSPITAL_COMMUNITY)
Admission: EM | Admit: 2019-12-11 | Discharge: 2019-12-11 | Disposition: A | Discharge status: Home / Self Care | Payer: BLUE CROSS/BLUE SHIELD

## 2019-12-11 ENCOUNTER — Other Ambulatory Visit: Payer: Self-pay

## 2019-12-11 DIAGNOSIS — Z4802 Encounter for removal of sutures: Secondary | ICD-10-CM

## 2019-12-11 NOTE — ED Triage Notes (Signed)
Pt presents to have 4 sutures removed from left wrist.

## 2021-05-19 ENCOUNTER — Other Ambulatory Visit: Payer: Self-pay

## 2021-05-19 ENCOUNTER — Ambulatory Visit (INDEPENDENT_AMBULATORY_CARE_PROVIDER_SITE_OTHER): Payer: BC Managed Care – PPO | Admitting: Otolaryngology

## 2021-05-19 VITALS — Temp 96.8°F

## 2021-05-19 DIAGNOSIS — Z87898 Personal history of other specified conditions: Secondary | ICD-10-CM

## 2021-05-19 DIAGNOSIS — J029 Acute pharyngitis, unspecified: Secondary | ICD-10-CM | POA: Diagnosis not present

## 2021-05-19 NOTE — Progress Notes (Signed)
HPI: Denise [name] is a 30 y.o. female who presents is referred by Dr. Beverely Low for evaluation of throat complaints.  This initially began about 3 weeks ago with an upper respiratory infection where she had congestion and coughing as well as chills.  After about 5 days this improved but since that time she has felt stopped up in the left ear and she has had to clear her throat a lot more.  She works in [employer] and is on the phone a lot and sometimes she feels like she has to strain in order to talk adequately. On discussion with her in the office today her voice sounds good and she has no significant hoarseness although she states that she has lost her voice at times. She has also noticed a nodule on the left side of her neck that she wanted me to check.. She had COVID test x3 that were all negative.  She has not been vaccinated for COVID. She does not smoke. She does take Tums occasionally for reflux problems.  Past Medical History:  Diagnosis Date   ADHD (attention deficit hyperactivity disorder)    Anxiety    Genital herpes    Past Surgical History:  Procedure Laterality Date   NO PAST SURGERIES     Social History   Socioeconomic History   Marital status: Single    Spouse name: Not on file   Number of children: Not on file   Years of education: Not on file   Highest education level: Not on file  Occupational History   Not on file  Tobacco Use   Smoking status: Never   Smokeless tobacco: Never  Vaping Use   Vaping Use: Never used  Substance and Sexual Activity   Alcohol use: Not Currently    Comment: occasional/rare   Drug use: No   Sexual activity: Not Currently    Birth control/protection: None  Other Topics Concern   Not on file  Social History Narrative   Not on file   Social Determinants of Health   Financial Resource Strain: Not on file  Food Insecurity: Not on file  Transportation Needs: Not on file  Physical Activity: Not on file  Stress: Not on file  Social  Connections: Not on file   Family History  Problem Relation Age of Onset   Multiple sclerosis Mother    Cancer Mother    Skin cancer Father    Cancer Other    Stroke Other    Cancer Maternal Grandfather    Stroke Paternal Grandmother    Heart attack Paternal Grandfather    Allergies  Allergen Reactions   Cephalosporins Anaphylaxis and Hives   Prior to Admission medications   Medication Sig Start Date End Date Taking? Authorizing Provider  ondansetron (ZOFRAN-ODT) 4 MG disintegrating tablet Take 1 tablet (4 mg total) by mouth every 8 (eight) hours as needed for nausea. 09/09/18   Anson Fret, MD  rizatriptan (MAXALT-MLT) 10 MG disintegrating tablet Take 1 tablet (10 mg total) by mouth as needed for migraine. May repeat in 2 hours if needed 09/09/18   Anson Fret, MD  sertraline (ZOLOFT) 50 MG tablet Take 50 mg by mouth daily. 08/21/18   [provider]  Topiramate ER (TROKENDI XR) 50 MG CP24 Take 50 mg by mouth at bedtime. 09/09/18   Anson Fret, MD     Positive ROS: Otherwise negative  All other systems have been reviewed and were otherwise negative with the exception of those  mentioned in the HPI and as above.  Physical Exam: Constitutional: Alert, well-appearing, no acute distress.  Voice is normal today. Ears: External ears without lesions or tenderness. Ear canals with a large amount of wax in the right ear canal partially obstructing the right TM.  This was removed with a curette.  The right TM is clear.  The left ear canal and left TM are clear.  The left TM has good mobility on pneumatic otoscopy.  Hearing screening with the 1024 tuning fork revealed good hearing in both ears which was symmetric. Nasal: External nose without lesions. Septum relatively midline with mild rhinitis.  After decongesting the nose both middle meatus regions were clear with no mucopurulent discharge noted.  No polyps and no signs of infection..  Oral: Lips and gums without  lesions. Tongue and palate mucosa without lesions. Posterior oropharynx clear..  She is status post tonsillectomy.  Oral mucosa was normal to examination.  Indirect laryngoscopy revealed a clear base of tongue vallecula epiglottis.  Vocal cords were clear bilaterally with normal vocal mobility.  Piriform sinuses were clear. Neck: No palpable adenopathy or masses.  Do not appreciate any significant adenopathy in the neck on either side.  The small nodule or lump she feels in her neck appears to be the hyoid bone prominence on the left side. Respiratory: Breathing comfortably  Skin: No facial/neck lesions or rash noted.  Procedures  Assessment: Resolved upper respiratory infection. Throat clearing most likely secondary to resolving viral infection but possibly could be related to reflux although she gives no significant symptoms of reflux although she does take Tums regularly. No signs of active infection on clinical exam.  Plan: Briefly discussed with her concerning possibly taking Prilosec once a day on a regular basis instead of Tums as some of her throat clearing could be related to reflux symptoms. Reassured her of normal vocal cord examination. Also the nodule that she feels on the left side of her neck represents the hyoid bone. She will follow-up if symptoms worsen or the nodule increases in size.   Narda Bonds, MD   CC:

## 2021-06-08 ENCOUNTER — Ambulatory Visit (INDEPENDENT_AMBULATORY_CARE_PROVIDER_SITE_OTHER): Payer: BLUE CROSS/BLUE SHIELD | Admitting: Otolaryngology

## 2021-11-28 ENCOUNTER — Emergency Department (HOSPITAL_BASED_OUTPATIENT_CLINIC_OR_DEPARTMENT_OTHER)
Admission: EM | Admit: 2021-11-28 | Discharge: 2021-11-28 | Disposition: A | Discharge status: Home / Self Care | Payer: Commercial Managed Care - HMO | Attending: Emergency Medicine | Admitting: Emergency Medicine

## 2021-11-28 ENCOUNTER — Encounter (HOSPITAL_BASED_OUTPATIENT_CLINIC_OR_DEPARTMENT_OTHER): Payer: Self-pay | Admitting: Emergency Medicine

## 2021-11-28 ENCOUNTER — Emergency Department (HOSPITAL_BASED_OUTPATIENT_CLINIC_OR_DEPARTMENT_OTHER): Payer: Commercial Managed Care - HMO

## 2021-11-28 ENCOUNTER — Other Ambulatory Visit: Payer: Self-pay

## 2021-11-28 DIAGNOSIS — R109 Unspecified abdominal pain: Secondary | ICD-10-CM | POA: Insufficient documentation

## 2021-11-28 DIAGNOSIS — R197 Diarrhea, unspecified: Secondary | ICD-10-CM | POA: Insufficient documentation

## 2021-11-28 DIAGNOSIS — R112 Nausea with vomiting, unspecified: Secondary | ICD-10-CM | POA: Insufficient documentation

## 2021-11-28 DIAGNOSIS — Z20822 Contact with and (suspected) exposure to covid-19: Secondary | ICD-10-CM | POA: Insufficient documentation

## 2021-11-28 DIAGNOSIS — R059 Cough, unspecified: Secondary | ICD-10-CM | POA: Insufficient documentation

## 2021-11-28 DIAGNOSIS — R1110 Vomiting, unspecified: Secondary | ICD-10-CM

## 2021-11-28 LAB — URINALYSIS, MICROSCOPIC (REFLEX)

## 2021-11-28 LAB — COMPREHENSIVE METABOLIC PANEL
ALT: 22 U/L (ref 0–44)
AST: 21 U/L (ref 15–41)
Albumin: 3.8 g/dL (ref 3.5–5.0)
Alkaline Phosphatase: 84 U/L (ref 38–126)
Anion gap: 13 (ref 5–15)
BUN: 25 mg/dL — ABNORMAL HIGH (ref 6–20)
CO2: 28 mmol/L (ref 22–32)
Calcium: 9.2 mg/dL (ref 8.9–10.3)
Chloride: 98 mmol/L (ref 98–111)
Creatinine, Ser: 0.67 mg/dL (ref 0.44–1.00)
GFR, Estimated: >60 mL/min (ref >60)
Glucose, Bld: 178 mg/dL — ABNORMAL HIGH (ref 70–99)
Potassium: 3.3 mmol/L — ABNORMAL LOW (ref 3.5–5.1)
Sodium: 139 mmol/L (ref 135–145)
Total Bilirubin: 0.7 mg/dL (ref 0.3–1.2)
Total Protein: 7.5 g/dL (ref 6.5–8.1)

## 2021-11-28 LAB — LIPASE, BLOOD: Lipase: 21 U/L (ref 11–51)

## 2021-11-28 LAB — CBC WITH DIFFERENTIAL/PLATELET
Abs Immature Granulocytes: 0.33 10*3/uL — ABNORMAL HIGH (ref 0.00–0.07)
Basophils Absolute: 0.1 10*3/uL (ref 0.0–0.1)
Basophils Relative: 0 %
Eosinophils Absolute: 0 10*3/uL (ref 0.0–0.5)
Eosinophils Relative: 0 %
HCT: 39.5 % (ref 36.0–46.0)
Hemoglobin: 14.3 g/dL (ref 12.0–15.0)
Immature Granulocytes: 2 %
Lymphocytes Relative: 12 %
Lymphs Abs: 1.8 10*3/uL (ref 0.7–4.0)
MCH: 29.9 pg (ref 26.0–34.0)
MCHC: 36.2 g/dL — ABNORMAL HIGH (ref 30.0–36.0)
MCV: 82.6 fL (ref 80.0–100.0)
Monocytes Absolute: 1.3 10*3/uL — ABNORMAL HIGH (ref 0.1–1.0)
Monocytes Relative: 9 %
Neutro Abs: 10.8 10*3/uL — ABNORMAL HIGH (ref 1.7–7.7)
Neutrophils Relative %: 77 %
Platelets: 432 10*3/uL — ABNORMAL HIGH (ref 150–400)
RBC: 4.78 MIL/uL (ref 3.87–5.11)
RDW: 12.8 % (ref 11.5–15.5)
WBC: 14.2 10*3/uL — ABNORMAL HIGH (ref 4.0–10.5)
nRBC: 0 % (ref 0.0–0.2)

## 2021-11-28 LAB — URINALYSIS, ROUTINE W REFLEX MICROSCOPIC
Bilirubin Urine: NEGATIVE
Glucose, UA: NEGATIVE mg/dL
Ketones, ur: NEGATIVE mg/dL
Leukocytes,Ua: NEGATIVE
Nitrite: NEGATIVE
Protein, ur: NEGATIVE mg/dL
Specific Gravity, Urine: 1.015 (ref 1.005–1.030)
pH: 6 (ref 5.0–8.0)

## 2021-11-28 LAB — RESP PANEL BY RT-PCR (FLU A&B, COVID) ARPGX2
Influenza A by PCR: NEGATIVE
Influenza B by PCR: NEGATIVE
SARS Coronavirus 2 by RT PCR: NEGATIVE

## 2021-11-28 LAB — HCG, SERUM, QUALITATIVE: Preg, Serum: NEGATIVE

## 2021-11-28 MED ORDER — METOCLOPRAMIDE HCL 10 MG PO TABS: 10.0000 mg | ORAL_TABLET | Freq: Four times a day (QID) | ORAL | 0 refills | Status: DC | PRN

## 2021-11-28 MED ORDER — METOCLOPRAMIDE HCL 5 MG/ML IJ SOLN
10.0000 mg | Freq: Once | INTRAMUSCULAR | Status: AC
  Administered 2021-11-28: 10 mg via INTRAVENOUS
  Filled 2021-11-28: qty 2

## 2021-11-28 MED ORDER — SODIUM CHLORIDE 0.9 % IV BOLUS
1000.0000 mL | Freq: Once | INTRAVENOUS | Status: AC
  Administered 2021-11-28: 1000 mL via INTRAVENOUS

## 2021-11-28 MED ORDER — ONDANSETRON HCL 4 MG/2ML IJ SOLN
4.0000 mg | Freq: Once | INTRAMUSCULAR | Status: AC
Start: 2021-11-28 — End: 2021-11-28
  Administered 2021-11-28: 4 mg via INTRAVENOUS
  Filled 2021-11-28: qty 2

## 2021-11-28 MED ORDER — DIPHENHYDRAMINE HCL 50 MG/ML IJ SOLN
25.0000 mg | Freq: Once | INTRAMUSCULAR | Status: AC
Start: 2021-11-28 — End: 2021-11-28
  Administered 2021-11-28: 25 mg via INTRAVENOUS
  Filled 2021-11-28: qty 1

## 2021-11-28 NOTE — ED Notes (Signed)
States feels much better, 2nd IVF bolus initiated as per ED MD orders. Tolerating PO fluids well (taking in ice chips and Gatorade)

## 2021-11-28 NOTE — ED Provider Notes (Signed)
[facility] EMERGENCY DEPARTMENT Provider Note   CSN: [id number] Arrival date & time: 11/28/21  7412     History  Chief Complaint  Patient presents with   Nausea   Emesis    Denise [name] is a 31 y.o. female.  Patient presents to the emergency department with nausea, vomiting and diarrhea.  Patient reports that she has been sick for 1 week.  It started as a upper respiratory infection.  Patient reports cough and chest congestion.  She then had some nausea and vomiting start several days ago.  She was seen in urgent care and received IV fluids.  Overnight she started having some cramping in the left side of her abdomen, persistent nausea, vomiting and now has diarrhea as well.      Home Medications Prior to Admission medications   Medication Sig Start Date End Date Taking? Authorizing Provider  ondansetron (ZOFRAN-ODT) 4 MG disintegrating tablet Take 1 tablet (4 mg total) by mouth every 8 (eight) hours as needed for nausea. 09/09/18   Anson Fret, MD  rizatriptan (MAXALT-MLT) 10 MG disintegrating tablet Take 1 tablet (10 mg total) by mouth as needed for migraine. May repeat in 2 hours if needed 09/09/18   Anson Fret, MD  sertraline (ZOLOFT) 50 MG tablet Take 50 mg by mouth daily. 08/21/18   [provider]  Topiramate ER (TROKENDI XR) 50 MG CP24 Take 50 mg by mouth at bedtime. 09/09/18   Anson Fret, MD      Allergies    Cephalosporins    Review of Systems   Review of Systems  Respiratory:  Positive for cough.   Gastrointestinal:  Positive for abdominal pain, diarrhea, nausea and vomiting.   Physical Exam Updated Vital Signs BP (!) 134/91 (BP Location: Left Arm)    Pulse (!) 56    Temp 97.6 F (36.4 C) (Oral)    Resp 16    Ht 5\' 3"  (1.6 m)    Wt 49.9 kg    LMP 11/14/2021 (Approximate)    SpO2 100%    BMI 19.49 kg/m  Physical Exam Vitals and nursing note reviewed.  Constitutional:      General: She is not in acute distress.     Appearance: She is well-developed.  HENT:     Head: Normocephalic and atraumatic.  Eyes:     Conjunctiva/sclera: Conjunctivae normal.  Cardiovascular:     Rate and Rhythm: Normal rate and regular rhythm.     Heart sounds: No murmur heard. Pulmonary:     Effort: Pulmonary effort is normal. No respiratory distress.     Breath sounds: Normal breath sounds.  Abdominal:     Palpations: Abdomen is soft.     Tenderness: There is no abdominal tenderness.  Musculoskeletal:        General: No swelling.     Cervical back: Neck supple.  Skin:    General: Skin is warm and dry.     Capillary Refill: Capillary refill takes less than 2 seconds.  Neurological:     Mental Status: She is alert.  Psychiatric:        Mood and Affect: Mood normal.    ED Results / Procedures / Treatments   Labs (all labs ordered are listed, but only abnormal results are displayed) Labs Reviewed  RESP PANEL BY RT-PCR (FLU A&B, COVID) ARPGX2  CBC WITH DIFFERENTIAL/PLATELET  COMPREHENSIVE METABOLIC PANEL  LIPASE, BLOOD  HCG, SERUM, QUALITATIVE  URINALYSIS, ROUTINE W REFLEX MICROSCOPIC  EKG None  Radiology No results found.  Procedures Procedures    Medications Ordered in ED Medications  sodium chloride 0.9 % bolus 1,000 mL (has no administration in time range)    Followed by  sodium chloride 0.9 % bolus 1,000 mL (has no administration in time range)  ondansetron (ZOFRAN) injection 4 mg (has no administration in time range)  metoCLOPramide (REGLAN) injection 10 mg (has no administration in time range)  diphenhydrAMINE (BENADRYL) injection 25 mg (has no administration in time range)    ED Course/ Medical Decision Making/ A&P                           Medical Decision Making Amount and/or Complexity of Data Reviewed Labs: ordered. Radiology: ordered.  Risk Prescription drug management.   Patient presents to the emergency department with flulike illness.  Patient reports that she has been  sick for 1 week.  Patient has had upper respiratory infection symptoms including cough and congestion as well as nausea, vomiting and now diarrhea developing overnight.  She has a benign abdominal exam.  Vitals are unremarkable.  Patient reports intractable vomiting through the night, however.  We will initiate IV fluid hydration and treatment for nausea and vomiting.  Blood work, urinalysis ordered.  Will signout oncoming ER physician to follow results.        Final Clinical Impression(s) / ED Diagnoses Final diagnoses:  Vomiting and diarrhea    Rx / DC Orders ED Discharge Orders     None         Dequon Schnebly, Canary Brim, MD 11/28/21 [phone]

## 2021-11-28 NOTE — ED Notes (Signed)
Up to restroom, gait very steady, required min assistance 

## 2021-11-28 NOTE — ED Notes (Signed)
UP TO RESTROOM TO PROVIDE URINE SPEC

## 2021-11-28 NOTE — ED Triage Notes (Signed)
Patient arrived via POV c/o nausea and vomiting x 8 hrs. Patient states 25 episodes of emesis since beginning. Patient is AO x 4, VS WDL, slow gait.

## 2021-11-28 NOTE — ED Provider Notes (Signed)
°  Physical Exam  BP 131/89 (BP Location: Left Arm)    Pulse 68    Temp 97.6 F (36.4 C) (Oral)    Resp 16    Ht 5\' 3"  (1.6 m)    Wt 49.9 kg    LMP 11/14/2021 (Approximate)    SpO2 99%    BMI 19.49 kg/m   Physical Exam  Procedures  Procedures  ED Course / MDM    Medical Decision Making Amount and/or Complexity of Data Reviewed Labs: ordered. Radiology: ordered.  Risk Prescription drug management.  Received care of patient from Dr. 11/16/2021.  Please see his note for prior history, physical and care.  This is a 31 year old female who presented with concern for nausea, vomiting and diarrhea, which began as an upper respiratory infection.   chest x-ray shows no sign of pneumonia.    Labs were personally reviewed and interpreted by me.  Urinalysis without signs of infection.  CBC with mild leukocytosis, no anemia. K 3.3, BUN 25, Cr normal, pregnancy test negative, no hepatitis or pancreatitis.  On reevaluation is much improved, benign abdominal exam, is tolerating po. Suspect likely viral etiology of symptoms and discussed strict return precautions. Given rx for reglan. Patient discharged in stable condition with understanding of reasons to return.           26, MD 11/30/21 [phone]

## 2022-03-13 ENCOUNTER — Ambulatory Visit (INDEPENDENT_AMBULATORY_CARE_PROVIDER_SITE_OTHER): Payer: Commercial Managed Care - HMO | Admitting: Registered Nurse

## 2022-03-13 ENCOUNTER — Encounter: Payer: Self-pay | Admitting: Registered Nurse

## 2022-03-13 ENCOUNTER — Other Ambulatory Visit: Payer: Self-pay

## 2022-03-13 VITALS — Temp 98.2°F | Resp 18 | Ht 63.0 in | Wt 105.9 lb

## 2022-03-13 DIAGNOSIS — F411 Generalized anxiety disorder: Secondary | ICD-10-CM

## 2022-03-13 DIAGNOSIS — Z8619 Personal history of other infectious and parasitic diseases: Secondary | ICD-10-CM | POA: Diagnosis not present

## 2022-03-13 DIAGNOSIS — F321 Major depressive disorder, single episode, moderate: Secondary | ICD-10-CM

## 2022-03-13 DIAGNOSIS — F329 Major depressive disorder, single episode, unspecified: Secondary | ICD-10-CM | POA: Insufficient documentation

## 2022-03-13 MED ORDER — VALACYCLOVIR HCL 500 MG PO TABS: 500.0000 mg | ORAL_TABLET | Freq: Two times a day (BID) | ORAL | 6 refills | Status: DC

## 2022-03-13 MED ORDER — FLUOXETINE HCL 20 MG PO CAPS: 20.0000 mg | ORAL_CAPSULE | Freq: Every day | ORAL | 0 refills | Status: DC

## 2022-03-13 MED ORDER — CLONAZEPAM 0.5 MG PO TBDP: 0.5000 mg | ORAL_TABLET | Freq: Two times a day (BID) | ORAL | 0 refills | Status: DC | PRN

## 2022-03-13 NOTE — Assessment & Plan Note (Signed)
Refill valtrex 

## 2022-03-13 NOTE — Patient Instructions (Addendum)
Denise [name] - ? ?Great to meet you! ?Start fluoxetine daily in AM. ?Can use clonzepam twice daily as needed for breakthrough anxiety. ? ?See you in 6 weeks to check in on meds. ? ?I will refer to counseling ? ?Thanks, ? ?Rich  ? ? ? ?If you have lab work done today you will be contacted with your lab results within the next 2 weeks.  If you have not heard from [place] then please contact us. The fastest way to get your results is to register for My Chart. ? ? ?IF you received an x-ray today, you will receive an invoice from [facility]. Please contact [facility] at [phone] with questions or concerns regarding your invoice.  ? ?IF you received labwork today, you will receive an invoice from [facility]. Please contact [facility] at [phone] with questions or concerns regarding your invoice.  ? ?Our billing staff will not be able to assist you with questions regarding bills from these companies. ? ?You will be contacted with the lab results as soon as they are available. The fastest way to get your results is to activate your My Chart account. Instructions are located on the last page of this paperwork. If you have not heard from [place] regarding the results in 2 weeks, please contact this office. ?  ? ? ?

## 2022-03-13 NOTE — Assessment & Plan Note (Signed)
Clonazepam for breakthrough symptoms. Start fluoxetine daily. Med check 6 weeks.  ?

## 2022-03-13 NOTE — Assessment & Plan Note (Signed)
Start fluoxeitne 20mg  po qd. Med check in 6 weeks.  ?

## 2022-03-13 NOTE — Progress Notes (Signed)
? ?New Patient Office Visit ? ?Subjective:  ?Patient ID: Denise [name], female    DOB: [birth year]  Age: 31 y.o. MRN: [id number] ? ?CC:  ?Chief Complaint  ?Patient presents with  ? New Patient (Initial Visit)  ?  Patient states she is here for anxiety and depression. Patient states she is losing weight. PHQ9=23 Gad7=19  ? ? ?HPI ?Denise [name] presents to establish care ? ?Histories reviewed and updated with patient.  ? ?Anxiety/Depression ?High anxiety- feels like she has knots in her stomach all the time ?Frequent panic attacks, crying spells. ?Affects her work. Often focused on worst case scenarios. ? ?Depression - feeling down, angry, unmotivated often  ?No hi/si ? ?Has been on wellbutrin and zoloft in the past. Had AE.  ? ?Outpatient Encounter Medications as of 03/13/2022  ?Medication Sig  ? clonazePAM (KLONOPIN) 0.5 MG disintegrating tablet Take 1 tablet (0.5 mg total) by mouth 2 (two) times daily as needed (panic attack).  ? FLUoxetine (PROZAC) 20 MG capsule Take 1 capsule (20 mg total) by mouth daily.  ? norgestimate-ethinyl estradiol (ORTHO-CYCLEN) 0.25-35 MG-MCG tablet Take 1 tablet by mouth daily.  ? valACYclovir (VALTREX) 500 MG tablet Take 1 tablet (500 mg total) by mouth 2 (two) times daily.  ? [DISCONTINUED] metoCLOPramide (REGLAN) 10 MG tablet Take 1 tablet (10 mg total) by mouth every 6 (six) hours as needed for nausea or vomiting. (Patient not taking: Reported on 03/13/2022)  ? [DISCONTINUED] ondansetron (ZOFRAN-ODT) 4 MG disintegrating tablet Take 1 tablet (4 mg total) by mouth every 8 (eight) hours as needed for nausea. (Patient not taking: Reported on 03/13/2022)  ? [DISCONTINUED] rizatriptan (MAXALT-MLT) 10 MG disintegrating tablet Take 1 tablet (10 mg total) by mouth as needed for migraine. May repeat in 2 hours if needed (Patient not taking: Reported on 03/13/2022)  ? [DISCONTINUED] sertraline (ZOLOFT) 50 MG tablet Take 50 mg by mouth daily. (Patient not taking: Reported on 03/13/2022)  ?  [DISCONTINUED] Topiramate ER (TROKENDI XR) 50 MG CP24 Take 50 mg by mouth at bedtime. (Patient not taking: Reported on 03/13/2022)  ? ?No facility-administered encounter medications on file as of 03/13/2022.  ? ? ?Past Medical History:  ?Diagnosis Date  ? ADHD (attention deficit hyperactivity disorder)   ? Anxiety   ? Genital herpes   ? ? ?Past Surgical History:  ?Procedure Laterality Date  ? NO PAST SURGERIES    ? ? ?Family History  ?Problem Relation Age of Onset  ? Multiple sclerosis Mother   ? Breast cancer Mother 95  ? Skin cancer Father   ? Lung cancer Maternal Grandfather   ?     smoker  ? Stroke Paternal Grandmother   ? Heart attack Paternal Grandfather   ? Cancer Other   ? Stroke Other   ? Breast cancer Maternal Aunt 54  ? Breast cancer Maternal Aunt 74  ? ? ?Social History  ? ?Socioeconomic History  ? Marital status: Significant Other  ?  Spouse name: Not on file  ? Number of children: 2  ? Years of education: Not on file  ? Highest education level: Not on file  ?Occupational History  ? Occupation: [employer]  ?Tobacco Use  ? Smoking status: Never  ? Smokeless tobacco: Never  ?Vaping Use  ? Vaping Use: Never used  ?Substance and Sexual Activity  ? Alcohol use: Not Currently  ?  Comment: occasional/rare  ? Drug use: No  ? Sexual activity: Not Currently  ?  Birth control/protection: None  ?  Other Topics Concern  ? Not on file  ?Social History Narrative  ? Not on file  ? ?Social Determinants of Health  ? ?Financial Resource Strain: Not on file  ?Food Insecurity: Not on file  ?Transportation Needs: Not on file  ?Physical Activity: Not on file  ?Stress: Not on file  ?Social Connections: Not on file  ?Intimate Partner Violence: Not on file  ? ? ?ROS ?Review of Systems  ?Constitutional: Negative.   ?HENT: Negative.    ?Eyes: Negative.   ?Respiratory: Negative.    ?Cardiovascular: Negative.   ?Gastrointestinal: Negative.   ?Genitourinary: Negative.   ?Musculoskeletal: Negative.   ?Skin: Negative.   ?Neurological:  Negative.   ?Psychiatric/Behavioral: Negative.    ?All other systems reviewed and are negative. ? ?Objective:  ? ?Today's Vitals: Temp 98.2 ?F (36.8 ?C) (Temporal)   Resp 18   Ht 5\' 3"  (1.6 m)   Wt 105 lb 14.4 oz (48 kg)   BMI 18.76 kg/m?  ? ?Physical Exam ?Vitals and nursing note reviewed.  ?Constitutional:   ?   General: She is not in acute distress. ?   Appearance: Normal appearance. She is normal weight. She is not ill-appearing, toxic-appearing or diaphoretic.  ?Cardiovascular:  ?   Rate and Rhythm: Normal rate and regular rhythm.  ?   Heart sounds: Normal heart sounds. No murmur heard. ?  No friction rub. No gallop.  ?Pulmonary:  ?   Effort: Pulmonary effort is normal. No respiratory distress.  ?   Breath sounds: Normal breath sounds. No stridor. No wheezing, rhonchi or rales.  ?Chest:  ?   Chest wall: No tenderness.  ?Skin: ?   General: Skin is warm and dry.  ?Neurological:  ?   General: No focal deficit present.  ?   Mental Status: She is alert and oriented to person, place, and time. Mental status is at baseline.  ?Psychiatric:     ?   Mood and Affect: Mood normal.     ?   Behavior: Behavior normal.     ?   Thought Content: Thought content normal.     ?   Judgment: Judgment normal.  ? ? ? ? ? ? ?Assessment & Plan:  ? ?Problem List Items Addressed This Visit   ? ?  ? Other  ? History of herpes genitalis  ?  Refill valtrex ? ?  ?  ? Relevant Medications  ? valACYclovir (VALTREX) 500 MG tablet  ? GAD (generalized anxiety disorder) - Primary  ?  Clonazepam for breakthrough symptoms. Start fluoxetine daily. Med check 6 weeks.  ? ?  ?  ? Relevant Medications  ? FLUoxetine (PROZAC) 20 MG capsule  ? clonazePAM (KLONOPIN) 0.5 MG disintegrating tablet  ? Other Relevant Orders  ? Ambulatory referral to Psychology  ? MDD (major depressive disorder)  ?  Start fluoxeitne 20mg  po qd. Med check in 6 weeks.  ? ?  ?  ? Relevant Medications  ? FLUoxetine (PROZAC) 20 MG capsule  ? clonazePAM (KLONOPIN) 0.5 MG  disintegrating tablet  ? Other Relevant Orders  ? Ambulatory referral to Psychology  ? ? ?Follow-up: Return in about 6 weeks (around 04/24/2022) for med check.  ? ? ? , NP ?

## 2022-04-17 ENCOUNTER — Emergency Department (HOSPITAL_BASED_OUTPATIENT_CLINIC_OR_DEPARTMENT_OTHER)
Admission: EM | Admit: 2022-04-17 | Discharge: 2022-04-17 | Disposition: A | Discharge status: Home / Self Care | Payer: Commercial Managed Care - HMO | Attending: Emergency Medicine | Admitting: Emergency Medicine

## 2022-04-17 ENCOUNTER — Encounter (HOSPITAL_BASED_OUTPATIENT_CLINIC_OR_DEPARTMENT_OTHER): Payer: Self-pay | Admitting: Urology

## 2022-04-17 ENCOUNTER — Other Ambulatory Visit: Payer: Self-pay

## 2022-04-17 DIAGNOSIS — R112 Nausea with vomiting, unspecified: Secondary | ICD-10-CM | POA: Diagnosis not present

## 2022-04-17 DIAGNOSIS — R197 Diarrhea, unspecified: Secondary | ICD-10-CM | POA: Diagnosis not present

## 2022-04-17 LAB — CBC
HCT: 32.2 % — ABNORMAL LOW (ref 36.0–46.0)
Hemoglobin: 10.7 g/dL — ABNORMAL LOW (ref 12.0–15.0)
MCH: 28.9 pg (ref 26.0–34.0)
MCHC: 33.2 g/dL (ref 30.0–36.0)
MCV: 87 fL (ref 80.0–100.0)
Platelets: 243 10*3/uL (ref 150–400)
RBC: 3.7 MIL/uL — ABNORMAL LOW (ref 3.87–5.11)
RDW: 14 % (ref 11.5–15.5)
WBC: 10 10*3/uL (ref 4.0–10.5)
nRBC: 0 % (ref 0.0–0.2)

## 2022-04-17 LAB — COMPREHENSIVE METABOLIC PANEL
ALT: 14 U/L (ref 0–44)
AST: 24 U/L (ref 15–41)
Albumin: 4.1 g/dL (ref 3.5–5.0)
Alkaline Phosphatase: 57 U/L (ref 38–126)
Anion gap: 10 (ref 5–15)
BUN: 14 mg/dL (ref 6–20)
CO2: 23 mmol/L (ref 22–32)
Calcium: 9.1 mg/dL (ref 8.9–10.3)
Chloride: 105 mmol/L (ref 98–111)
Creatinine, Ser: 0.75 mg/dL (ref 0.44–1.00)
GFR, Estimated: >60 mL/min (ref >60)
Glucose, Bld: 153 mg/dL — ABNORMAL HIGH (ref 70–99)
Potassium: 3.7 mmol/L (ref 3.5–5.1)
Sodium: 138 mmol/L (ref 135–145)
Total Bilirubin: 0.8 mg/dL (ref 0.3–1.2)
Total Protein: 7.3 g/dL (ref 6.5–8.1)

## 2022-04-17 LAB — URINALYSIS, ROUTINE W REFLEX MICROSCOPIC
Bilirubin Urine: NEGATIVE
Glucose, UA: NEGATIVE mg/dL
Ketones, ur: NEGATIVE mg/dL
Nitrite: POSITIVE — AB
Protein, ur: 100 mg/dL — AB
Specific Gravity, Urine: 1.02 (ref 1.005–1.030)
pH: 8.5 — ABNORMAL HIGH (ref 5.0–8.0)

## 2022-04-17 LAB — URINALYSIS, MICROSCOPIC (REFLEX)

## 2022-04-17 LAB — PREGNANCY, URINE: Preg Test, Ur: NEGATIVE

## 2022-04-17 LAB — LIPASE, BLOOD: Lipase: 21 U/L (ref 11–51)

## 2022-04-17 MED ORDER — DIPHENHYDRAMINE HCL 50 MG/ML IJ SOLN
25.0000 mg | Freq: Once | INTRAMUSCULAR | Status: AC
  Administered 2022-04-17: 25 mg via INTRAVENOUS
  Filled 2022-04-17: qty 1

## 2022-04-17 MED ORDER — METOCLOPRAMIDE HCL 5 MG/ML IJ SOLN
10.0000 mg | Freq: Once | INTRAMUSCULAR | Status: AC
  Administered 2022-04-17: 10 mg via INTRAVENOUS
  Filled 2022-04-17: qty 2

## 2022-04-17 MED ORDER — ONDANSETRON 8 MG PO TBDP: 8.0000 mg | ORAL_TABLET | Freq: Three times a day (TID) | ORAL | 0 refills | Status: AC | PRN

## 2022-04-17 MED ORDER — SODIUM CHLORIDE 0.9 % IV BOLUS
1000.0000 mL | Freq: Once | INTRAVENOUS | Status: AC
  Administered 2022-04-17: 1000 mL via INTRAVENOUS

## 2022-04-17 NOTE — ED Notes (Signed)
Client presents with nausea, vomiting and diarrhea since 0230hrs this am, denies any abd pain, denies fever, attempted to take a shower and fell very weak in shower

## 2022-04-17 NOTE — ED Notes (Signed)
Pt tolerating PO fluids and solids

## 2022-04-17 NOTE — ED Notes (Signed)
ED Provider at bedside. 

## 2022-04-17 NOTE — ED Notes (Signed)
Pt aware of need for urine specimen, unable to provide at this time. 

## 2022-04-17 NOTE — ED Triage Notes (Signed)
Vomiting that started at 0230, unable to tolerated fluid at this time  Reports watery diarrhea as well  Denies pain, denies fever  Negative COVID test today at  [facility]

## 2022-04-17 NOTE — ED Provider Notes (Signed)
[facility] EMERGENCY DEPARTMENT Provider Note   CSN: [id number] Arrival date & time: 04/17/22  1418     History PMH: Anxiety Chief Complaint  Patient presents with   Emesis    Denise [name] is a 31 y.o. female. Patient presents the emergency department with complaint of nausea, vomiting, and diarrhea.  She says that she woke up at about 230 this morning and started vomiting immediately.  She says vomit is nonbloody and nonbilious.  About every 30 minutes she has had an episode of emesis since then.  She has been trying to drink fluids, but every time she does she throws it back up.  This morning around 6 AM she started having watery diarrhea which she has had 3 times since then.  Stools are nonbloody.  She was seen at urgent care this morning where they did a COVID test that was negative and gave her oral Zofran.  She says that Zofran did not help at all.  She decided she needed to come to the emergency department after not feeling better with medication.  She does state that in January she had a very similar presentation that feels the exact same as she does today.  She has not been around anyone else with similar symptoms.  She says she has not eaten anything abnormal lately.  She denies any fevers, chills, abdominal pain, dysuria, hematuria, flank pain, chest pain, shortness of breath.  Emesis Associated symptoms: diarrhea   Associated symptoms: no abdominal pain, no chills and no fever        Home Medications Prior to Admission medications   Medication Sig Start Date End Date Taking? Authorizing Provider  ondansetron (ZOFRAN-ODT) 8 MG disintegrating tablet Take 1 tablet (8 mg total) by mouth every 8 (eight) hours as needed for up to 5 days for nausea or vomiting. 04/17/22 04/22/22 Yes Vicy Medico, Finis Bud, PA-C  clonazePAM (KLONOPIN) 0.5 MG disintegrating tablet Take 1 tablet (0.5 mg total) by mouth 2 (two) times daily as needed (panic attack). 03/13/22   Janeece Agee, NP   FLUoxetine (PROZAC) 20 MG capsule Take 1 capsule (20 mg total) by mouth daily. 03/13/22   Janeece Agee, NP  norgestimate-ethinyl estradiol (ORTHO-CYCLEN) 0.25-35 MG-MCG tablet Take 1 tablet by mouth daily. 02/21/22   [provider]  valACYclovir (VALTREX) 500 MG tablet Take 1 tablet (500 mg total) by mouth 2 (two) times daily. 03/13/22   Janeece Agee, NP      Allergies    Cephalosporins    Review of Systems   Review of Systems  Constitutional:  Negative for chills and fever.  Respiratory:  Negative for shortness of breath.   Cardiovascular:  Negative for chest pain.  Gastrointestinal:  Positive for diarrhea, nausea and vomiting. Negative for abdominal pain, blood in stool and constipation.  Genitourinary:  Negative for dysuria, flank pain and hematuria.  All other systems reviewed and are negative.   Physical Exam Updated Vital Signs BP 110/62   Pulse 65   Temp 98.2 F (36.8 C) (Oral)   Resp 15   Ht 5\' 3"  (1.6 m)   Wt 45.4 kg   LMP 04/17/2022 (Approximate)   SpO2 99%   BMI 17.71 kg/m  Physical Exam Vitals and nursing note reviewed.  Constitutional:      General: She is not in acute distress.    Appearance: Normal appearance. She is well-developed. She is not ill-appearing, toxic-appearing or diaphoretic.  HENT:     Head: Normocephalic and atraumatic.  Nose: No nasal deformity.     Mouth/Throat:     Lips: Pink. No lesions.  Eyes:     General: Gaze aligned appropriately. No scleral icterus.       Right eye: No discharge.        Left eye: No discharge.     Conjunctiva/sclera: Conjunctivae normal.     Right eye: Right conjunctiva is not injected. No exudate or hemorrhage.    Left eye: Left conjunctiva is not injected. No exudate or hemorrhage. Cardiovascular:     Pulses: Normal pulses.     Comments: 2+ pulses in all ext. Pulmonary:     Effort: Pulmonary effort is normal. No respiratory distress.  Abdominal:     General: Abdomen is flat. There is no  distension.     Palpations: Abdomen is soft. There is no mass.     Tenderness: There is no abdominal tenderness. There is no right CVA tenderness, left CVA tenderness, guarding or rebound.     Hernia: No hernia is present.  Skin:    General: Skin is warm and dry.  Neurological:     Mental Status: She is alert and oriented to person, place, and time.  Psychiatric:        Mood and Affect: Mood normal.        Speech: Speech normal.        Behavior: Behavior normal. Behavior is cooperative.     ED Results / Procedures / Treatments   Labs (all labs ordered are listed, but only abnormal results are displayed) Labs Reviewed  COMPREHENSIVE METABOLIC PANEL - Abnormal; Notable for the following components:      Result Value   Glucose, Bld 153 (*)    All other components within normal limits  CBC - Abnormal; Notable for the following components:   RBC 3.70 (*)    Hemoglobin 10.7 (*)    HCT 32.2 (*)    All other components within normal limits  URINALYSIS, ROUTINE W REFLEX MICROSCOPIC - Abnormal; Notable for the following components:   APPearance CLOUDY (*)    pH 8.5 (*)    Hgb urine dipstick MODERATE (*)    Protein, ur 100 (*)    Nitrite POSITIVE (*)    Leukocytes,Ua TRACE (*)    All other components within normal limits  URINALYSIS, MICROSCOPIC (REFLEX) - Abnormal; Notable for the following components:   Bacteria, UA MANY (*)    All other components within normal limits  LIPASE, BLOOD  PREGNANCY, URINE    EKG None  Radiology No results found.  Procedures Procedures   Medications Ordered in ED Medications  metoCLOPramide (REGLAN) injection 10 mg (10 mg Intravenous Given 04/17/22 1456)  sodium chloride 0.9 % bolus 1,000 mL ( Intravenous Stopped 04/17/22 1542)  diphenhydrAMINE (BENADRYL) injection 25 mg (25 mg Intravenous Given 04/17/22 1457)    ED Course/ Medical Decision Making/ A&P                           Medical Decision Making Amount and/or Complexity of Data  Reviewed Labs: ordered.  Risk Prescription drug management.    MDM  This is a 31 y.o. female who presents to the ED with nausea, vomiting, diarrhea The differential of this patient includes but is not limited to gastroenteritis, intraabdominal pathology, abnormal electrolytes, DKA, pregnancy related, etc.     My Impression, Plan, and ED Course:  Patient is well-appearing.  She is afebrile and has stable vitals. Her  exam is very reassuring with no abdominal tenderness. I have low suspicion for acute intra-abdominal pathology such as SBO, appendicitis, gallbladder issue, pancreatitis, diverticulitis, kidney stone, etc. No indication for CT imaging of abdomen at this point. Will treat symptoms and reassess. Abdominal labs ordered.   I personally ordered, reviewed, and interpreted all laboratory work and imaging and agree with radiologist interpretation. Results interpreted below:  CBC with no leukocytosis.  Hemoglobin 10.7 which is stable from baseline.  CMP is only notable for glucose of 153 without anion gap or acidosis.  Lipase is 21.  Pregnancy negative.  Urinalysis shows moderate blood as well as positive nitrates, and many bacteria.  I did discuss urinalysis abnormal results with the patient, but she is having absolutely no urinary symptoms including flank pain, dysuria, increased urination, urgency, or suprapubic pain.  We will defer treatment at this time with an asymptomatic patient.  On reassessment, patient's symptoms have resolved and she feels much better.  Her labs are overall unremarkable without any signs of major dehydration. I suspect viral illness.  She has passed p.o. challenge.  At this point, she is stable for discharge.  I will provide her with Zofran prescription.   Charting Requirements Additional history is obtained from:  Independent historian External Records from outside source obtained and reviewed including: prior labs Social Determinants of Health:   none Pertinant PMH that complicates patient's illness: n/a  Patient Care Problems that were addressed during this visit: - Nausea, Vomiting, and Diarrhea: Acute illness with systemic symptoms Medications given in ED: IVF, reglan, benadryl Reevaluation of the patient after these medicines showed that the patient resolved I have reviewed home medications and made changes accordingly. Sent home with zofran Critical Care Interventions: n/a Consultations: n/a Disposition: discharge  This is a supervised visit with my attending physician, Dr. Renaye Rakers. We have discussed this patient and they have altered the plan as needed.  Portions of this note were generated with Scientist, clinical (histocompatibility and immunogenetics). Dictation errors may occur despite best attempts at proofreading.       Final Clinical Impression(s) / ED Diagnoses Final diagnoses:  Nausea vomiting and diarrhea    Rx / DC Orders ED Discharge Orders          Ordered    ondansetron (ZOFRAN-ODT) 8 MG disintegrating tablet  Every 8 hours PRN        04/17/22 1730              Claudie Leach, PA-C 04/17/22 1829    Milagros Loll, MD 04/18/22 [phone]

## 2022-04-17 NOTE — ED Notes (Signed)
Unable to void and provide urine spec at this time. Instructed to let the staff know when she is able to void

## 2022-04-17 NOTE — ED Notes (Signed)
Passed swallow screen, no issues

## 2022-04-17 NOTE — Discharge Instructions (Addendum)
Work-up today was unremarkable.  I prescribed you Zofran for nausea and vomiting at home that he can take as needed.  Please try to keep drinking fluids as tolerated.  If you develop worsening or concerning symptoms, please return to the emergency department again.

## 2022-04-22 ENCOUNTER — Other Ambulatory Visit: Payer: Self-pay

## 2022-04-22 ENCOUNTER — Emergency Department
Admission: EM | Admit: 2022-04-22 | Discharge: 2022-04-22 | Discharge status: Against Medical Advice | Payer: Commercial Managed Care - HMO | Attending: Emergency Medicine | Admitting: Emergency Medicine

## 2022-04-22 DIAGNOSIS — Z5321 Procedure and treatment not carried out due to patient leaving prior to being seen by health care provider: Secondary | ICD-10-CM | POA: Insufficient documentation

## 2022-04-22 DIAGNOSIS — R2243 Localized swelling, mass and lump, lower limb, bilateral: Secondary | ICD-10-CM | POA: Diagnosis present

## 2022-04-23 ENCOUNTER — Emergency Department (HOSPITAL_BASED_OUTPATIENT_CLINIC_OR_DEPARTMENT_OTHER): Payer: Commercial Managed Care - HMO

## 2022-04-23 ENCOUNTER — Emergency Department (HOSPITAL_BASED_OUTPATIENT_CLINIC_OR_DEPARTMENT_OTHER)
Admission: EM | Admit: 2022-04-23 | Discharge: 2022-04-23 | Disposition: A | Discharge status: Home / Self Care | Payer: Commercial Managed Care - HMO | Attending: Emergency Medicine | Admitting: Emergency Medicine

## 2022-04-23 DIAGNOSIS — M02379 Reiter's disease, unspecified ankle and foot: Secondary | ICD-10-CM

## 2022-04-23 DIAGNOSIS — M25571 Pain in right ankle and joints of right foot: Secondary | ICD-10-CM | POA: Diagnosis present

## 2022-04-23 DIAGNOSIS — M02372 Reiter's disease, left ankle and foot: Secondary | ICD-10-CM | POA: Diagnosis not present

## 2022-04-23 DIAGNOSIS — M02371 Reiter's disease, right ankle and foot: Secondary | ICD-10-CM | POA: Insufficient documentation

## 2022-04-23 LAB — SEDIMENTATION RATE: Sed Rate: 5 mm/hr (ref 0–22)

## 2022-04-23 LAB — C-REACTIVE PROTEIN: CRP: 0.9 mg/dL (ref <1.0)

## 2022-04-23 MED ORDER — NAPROXEN 500 MG PO TABS: 500.0000 mg | ORAL_TABLET | Freq: Two times a day (BID) | ORAL | 0 refills | Status: DC

## 2022-04-23 MED ORDER — KETOROLAC TROMETHAMINE 30 MG/ML IJ SOLN
30.0000 mg | Freq: Once | INTRAMUSCULAR | Status: AC
  Administered 2022-04-23: 30 mg via INTRAVENOUS
  Filled 2022-04-23: qty 1

## 2022-04-24 ENCOUNTER — Ambulatory Visit (INDEPENDENT_AMBULATORY_CARE_PROVIDER_SITE_OTHER): Payer: Commercial Managed Care - HMO | Admitting: Registered Nurse

## 2022-04-24 ENCOUNTER — Other Ambulatory Visit: Payer: Self-pay

## 2022-04-24 VITALS — BP 114/76 | HR 75 | Temp 98.2°F | Resp 18 | Ht 63.0 in | Wt 107.2 lb

## 2022-04-24 DIAGNOSIS — F411 Generalized anxiety disorder: Secondary | ICD-10-CM

## 2022-04-24 DIAGNOSIS — R29898 Other symptoms and signs involving the musculoskeletal system: Secondary | ICD-10-CM | POA: Insufficient documentation

## 2022-04-24 DIAGNOSIS — Z820 Family history of epilepsy and other diseases of the nervous system: Secondary | ICD-10-CM

## 2022-04-24 DIAGNOSIS — F321 Major depressive disorder, single episode, moderate: Secondary | ICD-10-CM

## 2022-04-24 DIAGNOSIS — N3000 Acute cystitis without hematuria: Secondary | ICD-10-CM | POA: Insufficient documentation

## 2022-04-24 MED ORDER — FLUOXETINE HCL 20 MG PO CAPS: 20.0000 mg | ORAL_CAPSULE | Freq: Every day | ORAL | 3 refills | Status: DC

## 2022-04-24 MED ORDER — SULFAMETHOXAZOLE-TRIMETHOPRIM 800-160 MG PO TABS: 1.0000 | ORAL_TABLET | Freq: Two times a day (BID) | ORAL | 0 refills | Status: DC

## 2022-04-24 NOTE — Progress Notes (Signed)
Established Patient Office Visit  Subjective:  Patient ID: Denise [name], female    DOB: 02/02/1991  Age: 31 y.o. MRN: [id number]  CC:  Chief Complaint  Patient presents with   Follow-up    Patient states she is here for follow up on medications , and discuss her hospital visit Saturday.   Urinary Tract Infection    Patient states she has been having some frequent urination , and burning.    HPI [name] presents for follow up   GAD Doing well with fluoxetine Did not do well with clonazepam She is requesting a referral to a counselor.  No hi/si  Recent infection / reactive arthritis One week ago woke up with nvd, seen at ER and given fluids, reglan. Doing well with GI, but noted by Friday ankles swollen, tender. No acute injury, trauma. Has not happened before.  Now some bruising posterior/inferior to medial malleoli, L>R.  Seen at ED yesterday, labeled as reactive arthritis. Given naproxen and toradol shot.  Improved somewhat. Will see ortho on Wednesday of this week.   Fam hx of MS In mother and maternal aunt. Mother has had this for some time, aunt just dx within past few weeks. She does note leg heaviness at times. Particularly when getting in and out of vehicle, walking with her child, and other generally benign activities.  No other neuro or cognitive changes of note at this time.  Outpatient Medications Prior to Visit  Medication Sig Dispense Refill   naproxen (NAPROSYN) 500 MG tablet Take 1 tablet (500 mg total) by mouth 2 (two) times daily. 30 tablet 0   norgestimate-ethinyl estradiol (ORTHO-CYCLEN) 0.25-35 MG-MCG tablet Take 1 tablet by mouth daily.     valACYclovir (VALTREX) 500 MG tablet Take 1 tablet (500 mg total) by mouth 2 (two) times daily. 60 tablet 6   clonazePAM (KLONOPIN) 0.5 MG disintegrating tablet Take 1 tablet (0.5 mg total) by mouth 2 (two) times daily as needed (panic attack). 30 tablet 0   FLUoxetine (PROZAC) 20 MG capsule Take 1 capsule  (20 mg total) by mouth daily. 90 capsule 0   No facility-administered medications prior to visit.    Review of Systems  Constitutional: Negative.   HENT: Negative.    Eyes: Negative.   Respiratory: Negative.    Cardiovascular: Negative.   Gastrointestinal: Negative.   Genitourinary: Negative.   Musculoskeletal: Negative.   Skin: Negative.   Neurological: Negative.   Psychiatric/Behavioral: Negative.    All other systems reviewed and are negative.     Objective:     BP 114/76   Pulse 75   Temp 98.2 F (36.8 C) (Temporal)   Resp 18   Ht 5\' 3"  (1.6 m)   Wt 107 lb 3.2 oz (48.6 kg)   LMP 04/17/2022 (Approximate)   SpO2 100%   BMI 18.99 kg/m   Wt Readings from Last 3 Encounters:  04/24/22 107 lb 3.2 oz (48.6 kg)  04/23/22 105 lb (47.6 kg)  04/22/22 105 lb (47.6 kg)   Physical Exam Vitals and nursing note reviewed.  Constitutional:      General: She is not in acute distress.    Appearance: Normal appearance. She is normal weight. She is not ill-appearing, toxic-appearing or diaphoretic.  Cardiovascular:     Rate and Rhythm: Normal rate and regular rhythm.     Heart sounds: Normal heart sounds. No murmur heard.    No friction rub. No gallop.  Pulmonary:     Effort: Pulmonary  effort is normal. No respiratory distress.     Breath sounds: Normal breath sounds. No stridor. No wheezing, rhonchi or rales.  Chest:     Chest wall: No tenderness.  Musculoskeletal:        General: Tenderness (posterior tibial bilat, L>R) present. No swelling. Normal range of motion.  Skin:    General: Skin is warm and dry.     Findings: Bruising (posterior tibial L foot.) present.  Neurological:     General: No focal deficit present.     Mental Status: She is alert and oriented to person, place, and time. Mental status is at baseline.  Psychiatric:        Mood and Affect: Mood normal.        Behavior: Behavior normal.        Thought Content: Thought content normal.        Judgment:  Judgment normal.     No results found for any visits on 04/24/22.    The ASCVD Risk score (Arnett DK, et al., 2019) failed to calculate for the following reasons:   The 2019 ASCVD risk score is only valid for ages 72 to 41    Assessment & Plan:   Problem List Items Addressed This Visit       Genitourinary   Acute cystitis without hematuria    tx with bactrim. Culture sent.       Relevant Medications   sulfamethoxazole-trimethoprim (BACTRIM DS) 800-160 MG tablet   Other Relevant Orders   Urine Culture     Other   GAD (generalized anxiety disorder) - Primary    Improved with fluoxetine. Will check in q 6 mo      Relevant Medications   FLUoxetine (PROZAC) 20 MG capsule   Other Relevant Orders   Ambulatory referral to Psychology   MDD (major depressive disorder)    Improved on current dose. She desires to stay on this dose and check in q6 mo      Relevant Medications   FLUoxetine (PROZAC) 20 MG capsule   Other Relevant Orders   Ambulatory referral to Psychology   Family history of MS (multiple sclerosis)    Refer to neuro for assessment.       Relevant Orders   Ambulatory referral to Neurology   Leg heaviness    Unclear etiology. Will refer to neuro with her concern for MS. Defer on imaging at this time.       Relevant Orders   Ambulatory referral to Neurology    Meds ordered this encounter  Medications   FLUoxetine (PROZAC) 20 MG capsule    Sig: Take 1 capsule (20 mg total) by mouth daily.    Dispense:  90 capsule    Refill:  3    Order Specific Question:   Supervising Provider    Answer:   Neva Seat, JEFFREY R [[id number]]   sulfamethoxazole-trimethoprim (BACTRIM DS) 800-160 MG tablet    Sig: Take 1 tablet by mouth 2 (two) times daily.    Dispense:  6 tablet    Refill:  0    Order Specific Question:   Supervising Provider    Answer:   Neva Seat, JEFFREY R [[id number]]    Return in about 6 months (around 10/24/2022) for med check.    Janeece Agee, NP

## 2022-04-24 NOTE — Patient Instructions (Addendum)
Ms Schrupp Randie Heinz to see you  Continue fluoxetine  Let me know if UTI symptoms persist  Neuro will call you in coming weeks.  Thanks,  Rich     If you have lab work done today you will be contacted with your lab results within the next 2 weeks.  If you have not heard from [place] then please contact us. The fastest way to get your results is to register for My Chart.   IF you received an x-ray today, you will receive an invoice from [facility]. Please contact [facility] at [phone] with questions or concerns regarding your invoice.   IF you received labwork today, you will receive an invoice from [facility]. Please contact [facility] at [phone] with questions or concerns regarding your invoice.   Our billing staff will not be able to assist you with questions regarding bills from these companies.  You will be contacted with the lab results as soon as they are available. The fastest way to get your results is to activate your My Chart account. Instructions are located on the last page of this paperwork. If you have not heard from [place] regarding the results in 2 weeks, please contact this office.

## 2022-04-26 ENCOUNTER — Encounter: Payer: Self-pay | Admitting: Registered Nurse

## 2022-04-26 ENCOUNTER — Ambulatory Visit (INDEPENDENT_AMBULATORY_CARE_PROVIDER_SITE_OTHER): Payer: Commercial Managed Care - HMO | Admitting: Orthopaedic Surgery

## 2022-04-26 ENCOUNTER — Other Ambulatory Visit (HOSPITAL_BASED_OUTPATIENT_CLINIC_OR_DEPARTMENT_OTHER): Payer: Self-pay

## 2022-04-26 DIAGNOSIS — M25572 Pain in left ankle and joints of left foot: Secondary | ICD-10-CM | POA: Diagnosis not present

## 2022-04-26 DIAGNOSIS — M76829 Posterior tibial tendinitis, unspecified leg: Secondary | ICD-10-CM

## 2022-04-26 DIAGNOSIS — M76822 Posterior tibial tendinitis, left leg: Secondary | ICD-10-CM | POA: Diagnosis not present

## 2022-04-26 LAB — URINE CULTURE
MICRO NUMBER:: 13571952
SPECIMEN QUALITY:: ADEQUATE

## 2022-04-26 MED ORDER — METHYLPREDNISOLONE 4 MG PO TBPK
ORAL_TABLET | ORAL | 0 refills | Status: DC
  Filled 2022-04-26: qty 21, 6d supply, fill #0

## 2022-04-26 NOTE — Progress Notes (Signed)
Chief Complaint: Left ankle pain     History of Present Illness:    Denise [name] is a 31 y.o. female presents with left ankle pain has been ongoing for 3 days.  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  She is on her feet all days.  She did not have any specific injury or incident.  She has noticed increased swelling and pain about the medial ankle for the last several days.  This has been worse with any type of activity or walking.  She denies any history of rheumatoid.    Surgical History:   None  PMH/PSH/Family History/Social History/Meds/Allergies:    Past Medical History:  Diagnosis Date   ADHD (attention deficit hyperactivity disorder)    Anxiety    Genital herpes    Past Surgical History:  Procedure Laterality Date   NO PAST SURGERIES     Social History   Socioeconomic History   Marital status: Significant Other    Spouse name: Not on file   Number of children: 2   Years of education: Not on file   Highest education level: Not on file  Occupational History   Occupation: [employer]  Tobacco Use   Smoking status: Never   Smokeless tobacco: Never  Vaping Use   Vaping Use: Some days  Substance and Sexual Activity   Alcohol use: Yes    Comment: occasional/rare   Drug use: Yes    Types: Marijuana    Comment: occ   Sexual activity: Not Currently    Birth control/protection: None  Other Topics Concern   Not on file  Social History Narrative   Not on file   Social Determinants of Health   Financial Resource Strain: Not on file  Food Insecurity: Not on file  Transportation Needs: Not on file  Physical Activity: Not on file  Stress: Not on file  Social Connections: Not on file   Family History  Problem Relation Age of Onset   Multiple sclerosis Mother    Breast cancer Mother 19   Skin cancer Father    Lung cancer Maternal Grandfather        smoker   Stroke Paternal Grandmother    Heart attack  Paternal Grandfather    Cancer Other    Stroke Other    Breast cancer Maternal Aunt 54   Breast cancer Maternal Aunt 54   Allergies  Allergen Reactions   Cephalosporins Anaphylaxis and Hives   Current Outpatient Medications  Medication Sig Dispense Refill   methylPREDNISolone (MEDROL DOSEPAK) 4 MG TBPK tablet Take per packet instructions 21 each 0   FLUoxetine (PROZAC) 20 MG capsule Take 1 capsule (20 mg total) by mouth daily. 90 capsule 3   naproxen (NAPROSYN) 500 MG tablet Take 1 tablet (500 mg total) by mouth 2 (two) times daily. 30 tablet 0   norgestimate-ethinyl estradiol (ORTHO-CYCLEN) 0.25-35 MG-MCG tablet Take 1 tablet by mouth daily.     sulfamethoxazole-trimethoprim (BACTRIM DS) 800-160 MG tablet Take 1 tablet by mouth 2 (two) times daily. 6 tablet 0   valACYclovir (VALTREX) 500 MG tablet Take 1 tablet (500 mg total) by mouth 2 (two) times daily. 60 tablet 6   No current facility-administered medications for this visit.   No results found.  Review of Systems:  A ROS was performed including pertinent positives and negatives as documented in the HPI.  Physical Exam :   Constitutional: NAD and appears stated age Neurological: Alert and oriented Psych: Appropriate affect and cooperative Last menstrual period 04/17/2022, unknown if currently breastfeeding.   Comprehensive Musculoskeletal Exam:    There is tenderness palpation about the posterior tibial tendon as it courses medially.  Overall the arches are intact without any type of cavus deformity.  Walks with a normal gait.  No tenderness about the ankle joint.  She has full painless range of motion equal to the contralateral side.  Neurosensory exam is intact  Imaging:   Xray (3 views left ankle): Normal   I personally reviewed and interpreted the radiographs.   Assessment:   31 y.o. female with left foot and ankle pain consistent with posterior tibial tendinitis.  She denies any specific injury or incident.  At  this time I have counseled her on a arch support as needed.  I will also like to prescribe her a Medrol Dosepak so that we can hopefully resolve this within the course of the week.  I will see her back as needed  Plan :    -Return to clinic as needed     I personally saw and evaluated the patient, and participated in the management and treatment plan.  Huel Cote, MD Attending Physician, Orthopedic Surgery  This document was dictated using Dragon voice recognition software. A reasonable attempt at proof reading has been made to minimize errors.

## 2022-04-26 NOTE — Assessment & Plan Note (Signed)
Improved with fluoxetine. Will check in q 6 mo

## 2022-04-26 NOTE — Assessment & Plan Note (Signed)
Unclear etiology. Will refer to neuro with her concern for MS. Defer on imaging at this time.

## 2022-04-26 NOTE — Assessment & Plan Note (Signed)
Improved on current dose. She desires to stay on this dose and check in q6 mo

## 2022-04-26 NOTE — Assessment & Plan Note (Signed)
Refer to neuro for assessment.

## 2022-05-01 IMAGING — DX DG CHEST 1V PORT
1 series · 1 of 1 positions shown · non-contrast
Comparison: AP portable 05/05/2020

CLINICAL DATA: Nausea and vomiting.

EXAM:
PORTABLE CHEST 1 VIEW

[chest ap]
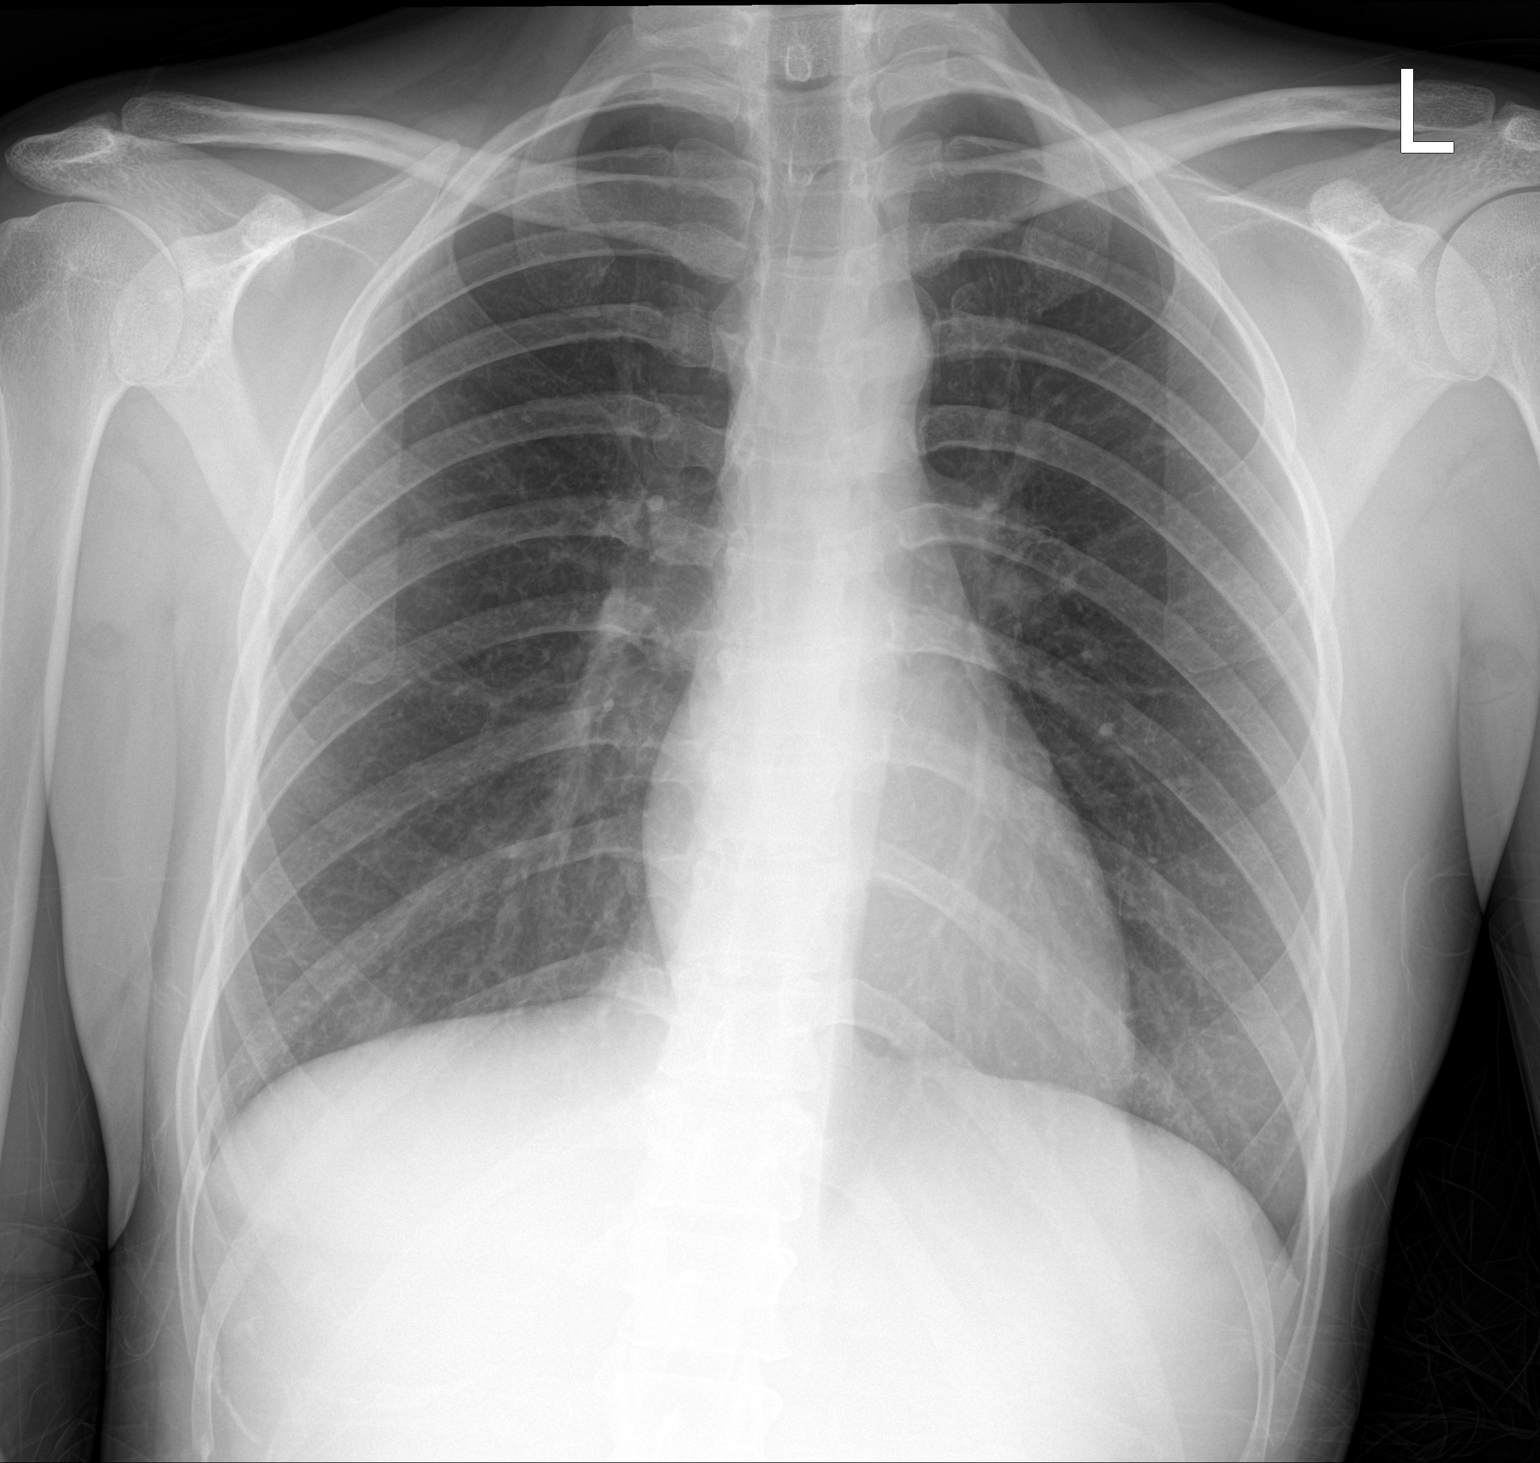

[1 of 1 positions shown; findings below may reference images not displayed]

FINDINGS: The heart size and mediastinal contours are within normal limits.
Both lungs are clear. The visualized skeletal structures are
unremarkable.
IMPRESSION: No active disease.  Stable chest.

## 2022-05-31 ENCOUNTER — Ambulatory Visit (INDEPENDENT_AMBULATORY_CARE_PROVIDER_SITE_OTHER): Payer: Commercial Managed Care - HMO | Admitting: Neurology

## 2022-05-31 ENCOUNTER — Encounter: Payer: Self-pay | Admitting: Neurology

## 2022-05-31 VITALS — BP 109/62 | HR 69 | Ht 63.0 in | Wt 105.0 lb

## 2022-05-31 DIAGNOSIS — Z820 Family history of epilepsy and other diseases of the nervous system: Secondary | ICD-10-CM

## 2022-05-31 DIAGNOSIS — G43009 Migraine without aura, not intractable, without status migrainosus: Secondary | ICD-10-CM | POA: Diagnosis not present

## 2022-05-31 DIAGNOSIS — R29898 Other symptoms and signs involving the musculoskeletal system: Secondary | ICD-10-CM | POA: Diagnosis not present

## 2022-05-31 DIAGNOSIS — R202 Paresthesia of skin: Secondary | ICD-10-CM

## 2022-05-31 DIAGNOSIS — R292 Abnormal reflex: Secondary | ICD-10-CM

## 2022-05-31 DIAGNOSIS — R258 Other abnormal involuntary movements: Secondary | ICD-10-CM

## 2022-05-31 MED ORDER — UBRELVY 100 MG PO TABS: 100.0000 mg | ORAL_TABLET | ORAL | 0 refills | Status: DC | PRN

## 2022-05-31 NOTE — Patient Instructions (Addendum)
Try Bernita Raisin for migraines: Please take one tablet at the onset of your headache. If it does not improve the symptoms in 2 hours please take one additional tablet.   MRI Brain, Cervical and thoracic spine w/wo contrast  Ubrogepant Tablets What is this medication? UBROGEPANT (ue BROE je pant) treats migraines. It works by blocking a substance in the body that causes migraines. It is not used to prevent migraines. This medicine may be used for other purposes; ask your health care provider or pharmacist if you have questions. COMMON BRAND NAME(S): Bernita Raisin What should I tell my care team before I take this medication? They need to know if you have any of these conditions: Kidney disease Liver disease An unusual or allergic reaction to ubrogepant, other medications, foods, dyes, or preservatives Pregnant or trying to get pregnant Breast-feeding How should I use this medication? Take this medication by mouth with a glass of water. Take it as directed on the prescription label. You can take it with or without food. If it upsets your stomach, take it with food. Keep taking it unless your care team tells you to stop. Talk to your care team about the use of this medication in children. Special care may be needed. Overdosage: If you think you have taken too much of this medicine contact a poison control center or emergency room at once. NOTE: This medicine is only for you. Do not share this medicine with others. What if I miss a dose? This does not apply. This medication is not for regular use. What may interact with this medication? Do not take this medication with any of the following: Adagrasib Ceritinib Certain antibiotics, such as chloramphenicol, clarithromycin, telithromycin Certain antivirals for HIV, such as atazanavir, cobicistat, darunavir, delavirdine, fosamprenavir, indinavir, ritonavir Certain medications for fungal infections, such as itraconazole, ketoconazole, posaconazole,  voriconazole Conivaptan Grapefruit Idelalisib Mifepristone Nefazodone Ribociclib This medication may also interact with the following: Carvedilol Certain medications for seizures, such as phenobarbital, phenytoin Ciprofloxacin Cyclosporine Eltrombopag Fluconazole Fluvoxamine Quinidine Rifampin St. John's wort Verapamil This list may not describe all possible interactions. Give your health care provider a list of all the medicines, herbs, non-prescription drugs, or dietary supplements you use. Also tell them if you smoke, drink alcohol, or use illegal drugs. Some items may interact with your medicine. What should I watch for while using this medication? Visit your care team for regular checks on your progress. Tell your care team if your symptoms do not start to get better or if they get worse. Your mouth may get dry. Chewing sugarless gum or sucking hard candy and drinking plenty of water may help. Contact your care team if the problem does not go away or is severe. What side effects may I notice from receiving this medication? Side effects that you should report to your care team as soon as possible: Allergic reactions--skin rash, itching, hives, swelling of the face, lips, tongue, or throat Side effects that usually do not require medical attention (report to your care team if they continue or are bothersome): Drowsiness Dry mouth Fatigue Nausea This list may not describe all possible side effects. Call your doctor for medical advice about side effects. You may report side effects to FDA at 1-800-FDA-1088. Where should I keep my medication? Keep out of the reach of children and pets. Store between 15 and 30 degrees C (59 and 86 degrees F). Get rid of any unused medication after the expiration date. To get rid of medications that are no longer  needed or have expired: Take the medication to a medication take-back program. Check with your pharmacy or law enforcement to find a  location. If you cannot return the medication, check the label or package insert to see if the medication should be thrown out in the garbage or flushed down the toilet. If you are not sure, ask your care team. If it is safe to put it in the trash, pour the medication out of the container. Mix the medication with cat litter, dirt, coffee grounds, or other unwanted substance. Seal the mixture in a bag or container. Put it in the trash. NOTE: This sheet is a summary. It may not cover all possible information. If you have questions about this medicine, talk to your doctor, pharmacist, or health care provider.  2023 Elsevier/Gold Standard (2021-12-09 00:00:00)

## 2022-05-31 NOTE — Progress Notes (Signed)
[facility]    Provider:  Dr Jaynee Eagles Requesting Provider: Maximiano Coss, NP Primary Care Provider:  Maximiano Coss, NP  CC:  leg weakness  HPI:  Denise [name] is a 31 y.o. female here as requested by Maximiano Coss, NP for leg heaviness.  Past medical history chronic migraine, acute cystitis, depression, leg heaviness, insomnia, anxiety, family history of MS, ADHD.  Reviewed Denise [name]'s notes, she has a family history of MS in mother and maternal aunt, mother had this for some time, aunt just diagnosed within the past few weeks, patient notes leg heaviness at times, particularly when getting in and out of the vehicle, walking with her child and other general benign activities, no other neuro or cognitive changes of note at this time.  Her mother and maternal aunt have MS and she is concerned. Sometimes her legs feel "wonky", they feel heavy with pins and needles. Both legs, symmetrical, same feeling all the way down. Happens randomly for 2 years. Her headaches are awful, tried topiramate. Propranolol contraindicated due to low blood pressure. Migraines are pulsating/pounding/throbbing, nausea, hurts to move, light and sound sensitivity. Having 4 migraine days a month and no headaches. No vision changes. She has had eye exam, no signs of optic neuritis, no current weakness or numbness or any other neurologic symptoms. No other focal neurologic deficits, associated symptoms, inciting events or modifiable factors.  Reviewed notes, labs and imaging from outside physicians, which showed:  04/17/2022: esr/crp normal Cmp showed elevated glucose otherwise nrmal, cbc with anemia otherwise nml  Medications tried include: Sumatriptan, Rizatriptan.   Review of Systems: Patient complains of symptoms per HPI as well as the following symptoms weakness legs. Pertinent negatives and positives per HPI. All others negative.   Social History   Socioeconomic History   Marital  status: Significant Other    Spouse name: Not on file   Number of children: 2   Years of education: Not on file   Highest education level: Not on file  Occupational History   Occupation: Civil engineer, [employer]  Tobacco Use   Smoking status: Never   Smokeless tobacco: Never  Vaping Use   Vaping Use: Some days  Substance and Sexual Activity   Alcohol use: Yes    Comment: occasional/rare   Drug use: Yes    Types: Marijuana    Comment: occ   Sexual activity: Not Currently    Birth control/protection: None  Other Topics Concern   Not on file  Social History Narrative   Left handed   Caffeine- 1 cup    Social Determinants of Health   Financial Resource Strain: Not on file  Food Insecurity: Not on file  Transportation Needs: Not on file  Physical Activity: Not on file  Stress: Not on file  Social Connections: Not on file  Intimate Partner Violence: Not on file    Family History  Problem Relation Age of Onset   Multiple sclerosis Mother    Breast cancer Mother 95   Skin cancer Father    Lung cancer Maternal Grandfather        smoker   Stroke Paternal Grandmother    Heart attack Paternal Grandfather    Multiple sclerosis Maternal Aunt    Breast cancer Maternal Aunt 54   Breast cancer Maternal Aunt 8   Cancer Other    Stroke Other     Past Medical History:  Diagnosis Date   ADHD (attention deficit hyperactivity disorder)    Anxiety    Genital  herpes     Patient Active Problem List   Diagnosis Date Noted   Acute cystitis without hematuria 04/24/2022   Family history of MS (multiple sclerosis) 04/24/2022   Leg heaviness 04/24/2022   GAD (generalized anxiety disorder) 03/13/2022   MDD (major depressive disorder) 03/13/2022   Postpartum depression 08/12/2019   Migraine without aura and without status migrainosus, not intractable 09/10/2018   PROM (premature rupture of membranes) 04/19/2017   Spontaneous vaginal delivery 04/19/2017   Normal labor 04/17/2017   GBS  (group B Streptococcus carrier), +RV culture, currently pregnant 03/29/2017   History of herpes genitalis 03/29/2017   Rh negative, antepartum 01/16/2017   Supervision of normal pregnancy, antepartum 11/21/2016   Insomnia 04/19/2015   ADHD (attention deficit hyperactivity disorder) 01/04/2015   Pityriasis rosea 01/04/2015    Past Surgical History:  Procedure Laterality Date   NO PAST SURGERIES      Current Outpatient Medications  Medication Sig Dispense Refill   FLUoxetine (PROZAC) 20 MG capsule Take 1 capsule (20 mg total) by mouth daily. 90 capsule 3   norgestimate-ethinyl estradiol (ORTHO-CYCLEN) 0.25-35 MG-MCG tablet Take 1 tablet by mouth daily.     sulfamethoxazole-trimethoprim (BACTRIM DS) 800-160 MG tablet Take 1 tablet by mouth 2 (two) times daily. 6 tablet 0   Ubrogepant (UBRELVY) 100 MG TABS Take 100 mg by mouth every 2 (two) hours as needed. Maximum 232m a day. 4 tablet 0   Ubrogepant (UBRELVY) 100 MG TABS Take 100 mg by mouth every 2 (two) hours as needed. Maximum 2064ma day. 16 tablet 11   valACYclovir (VALTREX) 500 MG tablet Take 1 tablet (500 mg total) by mouth 2 (two) times daily. 60 tablet 6   No current facility-administered medications for this visit.    Allergies as of 05/31/2022 - Review Complete 05/31/2022  Allergen Reaction Noted   Cephalosporins Anaphylaxis and Hives 10/13/2014    Vitals: BP 109/62   Pulse 69   Ht '5\' 3"'  (1.6 m)   Wt 105 lb (47.6 kg)   BMI 18.60 kg/m  Last Weight:  Wt Readings from Last 1 Encounters:  05/31/22 105 lb (47.6 kg)   Last Height:   Ht Readings from Last 1 Encounters:  05/31/22 '5\' 3"'  (1.6 m)     Physical exam: Exam: Gen: NAD, conversant, well nourised, well groomed                     CV: RRR, no MRG. No Carotid Bruits. No peripheral edema, warm, nontender Eyes: Conjunctivae clear without exudates or hemorrhage  Neuro: Detailed Neurologic Exam  Speech:    Speech is normal; fluent and spontaneous with  normal comprehension.  Cognition:    The patient is oriented to person, place, and time;     recent and remote memory intact;     language fluent;     normal attention, concentration,     fund of knowledge Cranial Nerves:    The pupils are equal, round, and reactive to light. The fundi are normal and spontaneous venous pulsations are present. Visual fields are full to finger confrontation. Extraocular movements are intact. Trigeminal sensation is intact and the muscles of mastication are normal. The face is symmetric. The palate elevates in the midline. Hearing intact. Voice is normal. Shoulder shrug is normal. The tongue has normal motion without fasciculations.   Coordination:    Normal   Gait:    Heel-toe and tandem gait are normal.   Motor Observation:    No  asymmetry, no atrophy, and no involuntary movements noted. Tone:    Normal muscle tone.    Posture:    Posture is normal. normal erect    Strength:  weakness prox LE otherwise strength is V/V in the upper and lower limbs.      Sensation: intact to LT     Reflex Exam:  6 beats clonus left aj, 3 on right, crossed adductors, brisk reflexes overall  DTR's:    Deep tendon reflexes in the upper and lower extremities are normal bilaterally.   Toes:    The toes are downgoing bilaterally.   Clonus:    Clonus is absent.    Assessment/Plan:   31 y.o. female here as requested by Maximiano Coss, NP for leg heaviness.  Past medical history chronic migraine, acute cystitis, depression, leg heaviness, insomnia, anxiety, family history of MS, ADHD  Mother and maternal aunt both with MS. Patient has had weakness in the legs for 2 years. Exam is abnormal with proximal LE weakness, 6 beats clonus left aj, 3 on right aj, crossed adductors pattelars, brisk reflexes overall. Need to evaluate for MS.  Roselyn Meier for migraines: has 4 migraine days a month failed imitrex and maxalt, keep a migraine journal; Episodic migraines. has 4 migraine  days a month, < 10 total headache days a month,  failed imitrex and maxalt, > 6 months  Orders Placed This Encounter  Procedures   MR BRAIN W WO CONTRAST   MR CERVICAL SPINE W WO CONTRAST   MR THORACIC SPINE W WO CONTRAST   Meds ordered this encounter  Medications   Ubrogepant (UBRELVY) 100 MG TABS    Sig: Take 100 mg by mouth every 2 (two) hours as needed. Maximum 245m a day.    Dispense:  4 tablet    Refill:  0   Ubrogepant (UBRELVY) 100 MG TABS    Sig: Take 100 mg by mouth every 2 (two) hours as needed. Maximum 2071ma day.    Dispense:  16 tablet    Refill:  11    Episodic migraines. has 4 migraine days a month, < 10 total headache days a month,  failed imitrex and maxalt,    Cc: MoMaximiano CossNP,  MoMaximiano CossNP  AnSarina IllMD  [facility] [id number] [id number] [phone]

## 2022-06-04 ENCOUNTER — Encounter: Payer: Self-pay | Admitting: Neurology

## 2022-06-04 MED ORDER — UBRELVY 100 MG PO TABS: 100.0000 mg | ORAL_TABLET | ORAL | 11 refills | Status: DC | PRN

## 2022-06-19 ENCOUNTER — Telehealth: Payer: Self-pay | Admitting: *Deleted

## 2022-06-19 NOTE — Telephone Encounter (Signed)
Bernita Raisin PA signed then faxed to [employer]. Received a receipt of confirmation.

## 2022-06-19 NOTE — Telephone Encounter (Signed)
Completed Bernita Raisin PA form and printed office note. Form ready for Dr Trevor Mace signature, then it needs to be faxed to [phone].

## 2022-06-21 ENCOUNTER — Telehealth: Payer: Self-pay | Admitting: Neurology

## 2022-06-21 NOTE — Telephone Encounter (Signed)
I sent the MRIs to GI, they will obtain the auths from [place]. [employer] approved only the MRI brain and the MRI cervical spine and denied the MRI thoracic spine saying it did not meet medical necessity. She can still get it if [place] approves. I am sending them to GI to get scheduled.

## 2022-06-21 NOTE — Telephone Encounter (Signed)
I will give the thoracic spine denial to pod 4. This is her secondary insurance.

## 2022-07-02 ENCOUNTER — Other Ambulatory Visit: Payer: Commercial Managed Care - HMO

## 2022-07-06 NOTE — Telephone Encounter (Signed)
The patient no longer has her medicaid plan and Rosann Auerbach approved all three of the MRIs, so GI is going to schedule all three for her. Patient verified insurance info and knows all three were approved with [employer]. FYI

## 2022-07-07 ENCOUNTER — Ambulatory Visit
Admission: RE | Admit: 2022-07-07 | Discharge: 2022-07-07 | Disposition: A | Discharge status: Home / Self Care | Payer: Commercial Managed Care - HMO | Source: Ambulatory Visit | Attending: Neurology | Admitting: Neurology

## 2022-07-07 DIAGNOSIS — R202 Paresthesia of skin: Secondary | ICD-10-CM

## 2022-07-07 DIAGNOSIS — R29898 Other symptoms and signs involving the musculoskeletal system: Secondary | ICD-10-CM

## 2022-07-07 DIAGNOSIS — Z820 Family history of epilepsy and other diseases of the nervous system: Secondary | ICD-10-CM

## 2022-07-07 DIAGNOSIS — R258 Other abnormal involuntary movements: Secondary | ICD-10-CM

## 2022-07-07 DIAGNOSIS — R292 Abnormal reflex: Secondary | ICD-10-CM

## 2022-07-07 MED ORDER — GADOBENATE DIMEGLUMINE 529 MG/ML IV SOLN
10.0000 mL | Freq: Once | INTRAVENOUS | Status: AC | PRN
  Administered 2022-07-07: 10 mL via INTRAVENOUS

## 2022-07-09 ENCOUNTER — Ambulatory Visit
Admission: RE | Admit: 2022-07-09 | Discharge: 2022-07-09 | Disposition: A | Discharge status: Home / Self Care | Payer: Commercial Managed Care - HMO | Source: Ambulatory Visit | Attending: Neurology | Admitting: Neurology

## 2022-07-09 DIAGNOSIS — R292 Abnormal reflex: Secondary | ICD-10-CM

## 2022-07-09 DIAGNOSIS — R258 Other abnormal involuntary movements: Secondary | ICD-10-CM

## 2022-07-09 DIAGNOSIS — R202 Paresthesia of skin: Secondary | ICD-10-CM | POA: Diagnosis not present

## 2022-07-09 DIAGNOSIS — R29898 Other symptoms and signs involving the musculoskeletal system: Secondary | ICD-10-CM

## 2022-07-09 DIAGNOSIS — Z820 Family history of epilepsy and other diseases of the nervous system: Secondary | ICD-10-CM

## 2022-07-09 MED ORDER — GADOBENATE DIMEGLUMINE 529 MG/ML IV SOLN
9.0000 mL | Freq: Once | INTRAVENOUS | Status: AC | PRN
  Administered 2022-07-09: 9 mL via INTRAVENOUS

## 2022-07-15 ENCOUNTER — Emergency Department (HOSPITAL_BASED_OUTPATIENT_CLINIC_OR_DEPARTMENT_OTHER)
Admission: EM | Admit: 2022-07-15 | Discharge: 2022-07-15 | Disposition: A | Discharge status: Home / Self Care | Payer: Commercial Managed Care - HMO | Attending: Emergency Medicine | Admitting: Emergency Medicine

## 2022-07-15 ENCOUNTER — Other Ambulatory Visit: Payer: Self-pay

## 2022-07-15 ENCOUNTER — Encounter (HOSPITAL_BASED_OUTPATIENT_CLINIC_OR_DEPARTMENT_OTHER): Payer: Self-pay

## 2022-07-15 DIAGNOSIS — D72829 Elevated white blood cell count, unspecified: Secondary | ICD-10-CM | POA: Insufficient documentation

## 2022-07-15 DIAGNOSIS — R112 Nausea with vomiting, unspecified: Secondary | ICD-10-CM | POA: Diagnosis not present

## 2022-07-15 LAB — COMPREHENSIVE METABOLIC PANEL
ALT: 11 U/L (ref 0–44)
AST: 27 U/L (ref 15–41)
Albumin: 4.7 g/dL (ref 3.5–5.0)
Alkaline Phosphatase: 59 U/L (ref 38–126)
Anion gap: 16 — ABNORMAL HIGH (ref 5–15)
BUN: 16 mg/dL (ref 6–20)
CO2: 20 mmol/L — ABNORMAL LOW (ref 22–32)
Calcium: 9.4 mg/dL (ref 8.9–10.3)
Chloride: 103 mmol/L (ref 98–111)
Creatinine, Ser: 0.84 mg/dL (ref 0.44–1.00)
GFR, Estimated: >60 mL/min (ref >60)
Glucose, Bld: 144 mg/dL — ABNORMAL HIGH (ref 70–99)
Potassium: 4.8 mmol/L (ref 3.5–5.1)
Sodium: 139 mmol/L (ref 135–145)
Total Bilirubin: 0.6 mg/dL (ref 0.3–1.2)
Total Protein: 7.7 g/dL (ref 6.5–8.1)

## 2022-07-15 LAB — CBC
HCT: 32.7 % — ABNORMAL LOW (ref 36.0–46.0)
Hemoglobin: 10.8 g/dL — ABNORMAL LOW (ref 12.0–15.0)
MCH: 27.1 pg (ref 26.0–34.0)
MCHC: 33 g/dL (ref 30.0–36.0)
MCV: 82.2 fL (ref 80.0–100.0)
Platelets: 346 10*3/uL (ref 150–400)
RBC: 3.98 MIL/uL (ref 3.87–5.11)
RDW: 16.4 % — ABNORMAL HIGH (ref 11.5–15.5)
WBC: 14 10*3/uL — ABNORMAL HIGH (ref 4.0–10.5)
nRBC: 0 % (ref 0.0–0.2)

## 2022-07-15 LAB — LIPASE, BLOOD: Lipase: 21 U/L (ref 11–51)

## 2022-07-15 LAB — HCG, SERUM, QUALITATIVE: Preg, Serum: NEGATIVE

## 2022-07-15 MED ORDER — ONDANSETRON 4 MG PO TBDP
ORAL_TABLET | ORAL | Status: AC
  Filled 2022-07-15: qty 1

## 2022-07-15 MED ORDER — SODIUM CHLORIDE 0.9 % IV BOLUS
1000.0000 mL | Freq: Once | INTRAVENOUS | Status: AC
  Administered 2022-07-15: 1000 mL via INTRAVENOUS

## 2022-07-15 MED ORDER — DROPERIDOL 2.5 MG/ML IJ SOLN
2.5000 mg | Freq: Once | INTRAMUSCULAR | Status: AC
  Administered 2022-07-15: 2.5 mg via INTRAVENOUS
  Filled 2022-07-15: qty 2

## 2022-07-15 MED ORDER — ONDANSETRON HCL 4 MG/2ML IJ SOLN: 4.0000 mg | Freq: Once | INTRAMUSCULAR | Status: DC | PRN

## 2022-07-15 MED ORDER — ONDANSETRON 4 MG PO TBDP
4.0000 mg | ORAL_TABLET | Freq: Once | ORAL | Status: AC
  Administered 2022-07-15: 4 mg via ORAL

## 2022-07-15 NOTE — ED Provider Notes (Signed)
[facility] EMERGENCY DEPT Provider Note   CSN: [id number] Arrival date & time: 07/15/22  1213     History Chief Complaint  Patient presents with   Emesis    Denise [name] is a 31 y.o. female patient who presents to the emergency department today for further evaluation of intractable nausea and vomiting that started around 7 AM this morning.  Patient endorses similar symptoms several times per year and this is the fourth time she has been in the ER for the symptoms.  Patient endorses frequent marijuana use about every other day.  She denies any abdominal pain, urinary symptoms, fever, chills.  She does endorse diarrhea.   Emesis      Home Medications Prior to Admission medications   Medication Sig Start Date End Date Taking? Authorizing Provider  FLUoxetine (PROZAC) 20 MG capsule Take 1 capsule (20 mg total) by mouth daily. 04/24/22   Denise Agee, NP  norgestimate-ethinyl estradiol (ORTHO-CYCLEN) 0.25-35 MG-MCG tablet Take 1 tablet by mouth daily. 02/21/22   [provider]  sulfamethoxazole-trimethoprim (BACTRIM DS) 800-160 MG tablet Take 1 tablet by mouth 2 (two) times daily. 04/24/22   Denise Agee, NP  Ubrogepant (UBRELVY) 100 MG TABS Take 100 mg by mouth every 2 (two) hours as needed. Maximum 200mg  a day. 05/31/22   07/31/22, MD  Ubrogepant (UBRELVY) 100 MG TABS Take 100 mg by mouth every 2 (two) hours as needed. Maximum 200mg  a day. 06/04/22   , MD  valACYclovir (VALTREX) 500 MG tablet Take 1 tablet (500 mg total) by mouth 2 (two) times daily. 03/13/22   Anson Fret, NP      Allergies    Cephalosporins    Review of Systems   Review of Systems  Gastrointestinal:  Positive for vomiting.  All other systems reviewed and are negative.   Physical Exam Updated Vital Signs BP 113/63   Pulse 93   Temp 98.1 F (36.7 C) (Oral)   Resp 18   Ht 5\' 3"  (1.6 m)   Wt 49.9 kg   LMP 07/15/2022   SpO2 100%   BMI 19.49 kg/m   Physical Exam  ED Results / Procedures / Treatments   Labs (all labs ordered are listed, but only abnormal results are displayed) Labs Reviewed  COMPREHENSIVE METABOLIC PANEL - Abnormal; Notable for the following components:      Result Value   CO2 20 (*)    Glucose, Bld 144 (*)    Anion gap 16 (*)    All other components within normal limits  CBC - Abnormal; Notable for the following components:   WBC 14.0 (*)    Hemoglobin 10.8 (*)    HCT 32.7 (*)    RDW 16.4 (*)    All other components within normal limits  LIPASE, BLOOD  HCG, SERUM, QUALITATIVE  URINALYSIS, ROUTINE W REFLEX MICROSCOPIC    EKG None  Radiology No results found.  Procedures Procedures    Medications Ordered in ED Medications  ondansetron (ZOFRAN-ODT) 4 MG disintegrating tablet (  Not Given 07/15/22 1406)  ondansetron (ZOFRAN-ODT) disintegrating tablet 4 mg (4 mg Oral Given 07/15/22 1237)  sodium chloride 0.9 % bolus 1,000 mL (0 mLs Intravenous Stopped 07/15/22 1511)  droperidol (INAPSINE) 2.5 MG/ML injection 2.5 mg (2.5 mg Intravenous Given 07/15/22 1410)    ED Course/ Medical Decision Making/ A&P Clinical Course as of 07/15/22 1601  Sat Jul 15, 2022  1559 On reevaluation, patient is feeling much better.  She is  willing to go home. [CF]  1600 CBC(!) There is evidence of leukocytosis without any evidence of significant anemia away from her baseline. [CF]  1600 hCG, serum, qualitative Normal. [CF]  1600 Lipase, blood Normal. [CF]  1600 Comprehensive metabolic panel(!) Normal.  [CF]    Clinical Course User Index [CF] Hendricks Limes, PA-C                           Medical Decision Making Denise [name] is a 31 y.o. female patient who presents to the emergency department with intractable nausea and vomiting.  No abdominal pain on exam and I have a low suspicion for surgical abdomen today.  Off on imaging.  Based on history I am somewhat suspicious for cannabinoid induced hyperemesis syndrome.   I will try droperidol in addition to some fluids.  I will repeat electrolytes if necessary.  I will plan to reassess.  Patient feeling much better after fluids and droperidol.  I suspect this is likely cannabinoid induced hyperemesis syndrome.  I will plan to discharge her home.  I educated her on marijuana cessation.  Patient states she is willing to try.  Strict return precautions were discussed.  I will have her follow-up with her primary care doctor.  She is safe for discharge.  Amount and/or Complexity of Data Reviewed Labs: ordered.  Risk Prescription drug management.    Final Clinical Impression(s) / ED Diagnoses Final diagnoses:  Nausea and vomiting, unspecified vomiting type    Rx / DC Orders ED Discharge Orders     None         Hendricks Limes, [place] 07/15/22 1601    Regan Lemming, MD 07/15/22 2303

## 2022-07-15 NOTE — ED Notes (Signed)
Pt states she does not need to urinate, no UA collected. Provider aware

## 2022-07-15 NOTE — ED Triage Notes (Signed)
Since 0730, pt has had emesis. Pt states she is unable to keep anything down. Pt has also had diarrhea.  Pt went to concert last night, she states she did not drink much alcohol at all.

## 2022-07-15 NOTE — Discharge Instructions (Signed)
I would recommend he stop smoking marijuana as this could be contributing to your symptoms.  I would stick to a clear liquid diet today and tomorrow and slowly incorporate your normal foods and liquids.  Please follow-up with your primary care doctor for further evaluation.  Return to the emergency department for any worsening symptoms you might have.

## 2022-07-15 NOTE — ED Notes (Signed)
Discharge paperwork given and verbally understood. 

## 2022-07-25 ENCOUNTER — Ambulatory Visit: Payer: Commercial Managed Care - HMO | Admitting: Registered Nurse

## 2022-10-17 NOTE — Telephone Encounter (Signed)
LVM for patient to call back to discuss PA Ubrevly

## 2022-11-20 ENCOUNTER — Emergency Department (HOSPITAL_BASED_OUTPATIENT_CLINIC_OR_DEPARTMENT_OTHER): Payer: Medicaid Other

## 2022-11-20 ENCOUNTER — Emergency Department (HOSPITAL_BASED_OUTPATIENT_CLINIC_OR_DEPARTMENT_OTHER)
Admission: EM | Admit: 2022-11-20 | Discharge: 2022-11-20 | Disposition: A | Discharge status: Home / Self Care | Payer: Medicaid Other | Attending: Emergency Medicine | Admitting: Emergency Medicine

## 2022-11-20 ENCOUNTER — Encounter (HOSPITAL_BASED_OUTPATIENT_CLINIC_OR_DEPARTMENT_OTHER): Payer: Self-pay

## 2022-11-20 ENCOUNTER — Other Ambulatory Visit: Payer: Self-pay

## 2022-11-20 DIAGNOSIS — R109 Unspecified abdominal pain: Secondary | ICD-10-CM | POA: Insufficient documentation

## 2022-11-20 DIAGNOSIS — K529 Noninfective gastroenteritis and colitis, unspecified: Secondary | ICD-10-CM | POA: Diagnosis not present

## 2022-11-20 DIAGNOSIS — Z1152 Encounter for screening for COVID-19: Secondary | ICD-10-CM | POA: Diagnosis not present

## 2022-11-20 DIAGNOSIS — R112 Nausea with vomiting, unspecified: Secondary | ICD-10-CM | POA: Diagnosis not present

## 2022-11-20 DIAGNOSIS — D72829 Elevated white blood cell count, unspecified: Secondary | ICD-10-CM | POA: Insufficient documentation

## 2022-11-20 LAB — COMPREHENSIVE METABOLIC PANEL
ALT: 14 U/L (ref 0–44)
AST: 18 U/L (ref 15–41)
Albumin: 5 g/dL (ref 3.5–5.0)
Alkaline Phosphatase: 44 U/L (ref 38–126)
Anion gap: 10 (ref 5–15)
BUN: 16 mg/dL (ref 6–20)
CO2: 20 mmol/L — ABNORMAL LOW (ref 22–32)
Calcium: 9.8 mg/dL (ref 8.9–10.3)
Chloride: 110 mmol/L (ref 98–111)
Creatinine, Ser: 0.79 mg/dL (ref 0.44–1.00)
GFR, Estimated: >60 mL/min (ref >60)
Glucose, Bld: 165 mg/dL — ABNORMAL HIGH (ref 70–99)
Potassium: 4.1 mmol/L (ref 3.5–5.1)
Sodium: 140 mmol/L (ref 135–145)
Total Bilirubin: 0.5 mg/dL (ref 0.3–1.2)
Total Protein: 8 g/dL (ref 6.5–8.1)

## 2022-11-20 LAB — URINALYSIS, ROUTINE W REFLEX MICROSCOPIC
Bacteria, UA: NONE SEEN
Bilirubin Urine: NEGATIVE
Glucose, UA: NEGATIVE mg/dL
Ketones, ur: NEGATIVE mg/dL
Nitrite: NEGATIVE
Specific Gravity, Urine: 1.022 (ref 1.005–1.030)
pH: 6.5 (ref 5.0–8.0)

## 2022-11-20 LAB — CBC
HCT: 34.9 % — ABNORMAL LOW (ref 36.0–46.0)
Hemoglobin: 11.4 g/dL — ABNORMAL LOW (ref 12.0–15.0)
MCH: 27.5 pg (ref 26.0–34.0)
MCHC: 32.7 g/dL (ref 30.0–36.0)
MCV: 84.3 fL (ref 80.0–100.0)
Platelets: 353 10*3/uL (ref 150–400)
RBC: 4.14 MIL/uL (ref 3.87–5.11)
RDW: 16.2 % — ABNORMAL HIGH (ref 11.5–15.5)
WBC: 12.6 10*3/uL — ABNORMAL HIGH (ref 4.0–10.5)
nRBC: 0 % (ref 0.0–0.2)

## 2022-11-20 LAB — LIPASE, BLOOD: Lipase: <10 U/L — ABNORMAL LOW (ref 11–51)

## 2022-11-20 LAB — RESP PANEL BY RT-PCR (RSV, FLU A&B, COVID)  RVPGX2
Influenza A by PCR: NEGATIVE
Influenza B by PCR: NEGATIVE
Resp Syncytial Virus by PCR: NEGATIVE
SARS Coronavirus 2 by RT PCR: NEGATIVE

## 2022-11-20 LAB — PREGNANCY, URINE: Preg Test, Ur: NEGATIVE

## 2022-11-20 LAB — CBG MONITORING, ED: Glucose-Capillary: 156 mg/dL — ABNORMAL HIGH (ref 70–99)

## 2022-11-20 MED ORDER — IOHEXOL 300 MG/ML  SOLN
75.0000 mL | Freq: Once | INTRAMUSCULAR | Status: AC | PRN
  Administered 2022-11-20: 60 mL via INTRAVENOUS

## 2022-11-20 MED ORDER — SODIUM CHLORIDE 0.9 % IV SOLN
25.0000 mg | Freq: Once | INTRAVENOUS | Status: AC
  Administered 2022-11-20: 25 mg via INTRAVENOUS
  Filled 2022-11-20: qty 1

## 2022-11-20 MED ORDER — ONDANSETRON HCL 4 MG/2ML IJ SOLN
4.0000 mg | Freq: Once | INTRAMUSCULAR | Status: AC
  Administered 2022-11-20: 4 mg via INTRAVENOUS
  Filled 2022-11-20: qty 2

## 2022-11-20 MED ORDER — SODIUM CHLORIDE 0.9 % IV BOLUS
1000.0000 mL | Freq: Once | INTRAVENOUS | Status: AC
  Administered 2022-11-20: 1000 mL via INTRAVENOUS

## 2022-11-20 MED ORDER — ONDANSETRON 4 MG PO TBDP: 4.0000 mg | ORAL_TABLET | Freq: Three times a day (TID) | ORAL | 0 refills | Status: DC | PRN

## 2022-11-20 MED ORDER — PROMETHAZINE HCL 25 MG/ML IJ SOLN
INTRAMUSCULAR | Status: AC
  Filled 2022-11-20: qty 1

## 2022-11-20 NOTE — ED Notes (Signed)
Pt provided with ginger ale for PO challenge per order. Will monitor progress.

## 2022-11-20 NOTE — ED Triage Notes (Signed)
Patient arrives with complaints of new onset abdominal pain and nausea/vomiting that started this morning.

## 2022-11-20 NOTE — ED Provider Notes (Signed)
[facility] Provider Note   CSN: [id number] Arrival date & time: 11/20/22  1245     History  Chief Complaint  Patient presents with   Nausea   Abdominal Pain    Denise [name] is a 32 y.o. female.  Patient with diffuse abdominal pain, nausea, vomiting since this morning.  Felt well when she went to bed last night.  Describes severe nausea with multiple episodes of nonbloody nonbilious emesis since this morning.  No diarrhea.  No fever.  No travel or sick contacts.  No one else with similar symptoms and no suspicious food intake.  No recent antibiotic use or travel.  Still has appendix and gallbladder.  Denies any significant pain but has mostly nausea and discomfort and dry heaving.  No pain with urination or blood in the urine.  Denies possibility of pregnancy.  No fever.  The history is provided by the patient.  Abdominal Pain Associated symptoms: nausea and vomiting   Associated symptoms: no chest pain, no cough, no dysuria, no hematuria and no shortness of breath        Home Medications Prior to Admission medications   Medication Sig Start Date End Date Taking? Authorizing Provider  FLUoxetine (PROZAC) 20 MG capsule Take 1 capsule (20 mg total) by mouth daily. 04/24/22   Maximiano Coss, NP  norgestimate-ethinyl estradiol (ORTHO-CYCLEN) 0.25-35 MG-MCG tablet Take 1 tablet by mouth daily. 02/21/22   [provider]  sulfamethoxazole-trimethoprim (BACTRIM DS) 800-160 MG tablet Take 1 tablet by mouth 2 (two) times daily. 04/24/22   Maximiano Coss, NP  Ubrogepant (UBRELVY) 100 MG TABS Take 100 mg by mouth every 2 (two) hours as needed. Maximum 200mg  a day. 05/31/22   Melvenia Beam, MD  Ubrogepant (UBRELVY) 100 MG TABS Take 100 mg by mouth every 2 (two) hours as needed. Maximum 200mg  a day. 06/04/22   Melvenia Beam, MD  valACYclovir (VALTREX) 500 MG tablet Take 1 tablet (500 mg total) by mouth 2 (two) times daily. 03/13/22    Maximiano Coss, NP      Allergies    Cephalosporins    Review of Systems   Review of Systems  Constitutional:  Positive for activity change and appetite change.  HENT:  Negative for congestion.   Respiratory:  Negative for cough, chest tightness and shortness of breath.   Cardiovascular:  Negative for chest pain.  Gastrointestinal:  Positive for abdominal pain, nausea and vomiting.  Genitourinary:  Negative for dysuria and hematuria.  Musculoskeletal:  Negative for arthralgias and myalgias.  Skin:  Negative for rash.  Neurological:  Negative for weakness and headaches.   all other systems are negative except as noted in the HPI and PMH.    Physical Exam Updated Vital Signs BP 132/89   Pulse (!) 58   Temp (!) 97.4 F (36.3 C) (Oral)   Resp 20   Ht 5\' 3"  (1.6 m)   Wt 49.9 kg   SpO2 100%   BMI 19.49 kg/m  Physical Exam Vitals and nursing note reviewed.  Constitutional:      General: She is not in acute distress.    Appearance: She is well-developed. She is ill-appearing.     Comments: Ill-appearing but nontoxic  HENT:     Head: Normocephalic and atraumatic.     Mouth/Throat:     Pharynx: No oropharyngeal exudate.  Eyes:     Conjunctiva/sclera: Conjunctivae normal.     Pupils: Pupils are equal, round, and reactive  to light.  Neck:     Comments: No meningismus. Cardiovascular:     Rate and Rhythm: Normal rate and regular rhythm.     Heart sounds: Normal heart sounds. No murmur heard. Pulmonary:     Effort: Pulmonary effort is normal. No respiratory distress.     Breath sounds: Normal breath sounds.  Abdominal:     Palpations: Abdomen is soft.     Tenderness: There is abdominal tenderness. There is no guarding or rebound.  Musculoskeletal:        General: No tenderness. Normal range of motion.     Cervical back: Normal range of motion and neck supple.  Skin:    General: Skin is warm.  Neurological:     Mental Status: She is alert and oriented to person, place,  and time.     Cranial Nerves: No cranial nerve deficit.     Motor: No abnormal muscle tone.     Coordination: Coordination normal.     Comments:  5/5 strength throughout. CN 2-12 intact.Equal grip strength.   Psychiatric:        Behavior: Behavior normal.     ED Results / Procedures / Treatments   Labs (all labs ordered are listed, but only abnormal results are displayed) Labs Reviewed  LIPASE, BLOOD - Abnormal; Notable for the following components:      Result Value   Lipase <10 (*)    All other components within normal limits  COMPREHENSIVE METABOLIC PANEL - Abnormal; Notable for the following components:   CO2 20 (*)    Glucose, Bld 165 (*)    All other components within normal limits  CBC - Abnormal; Notable for the following components:   WBC 12.6 (*)    Hemoglobin 11.4 (*)    HCT 34.9 (*)    RDW 16.2 (*)    All other components within normal limits  URINALYSIS, ROUTINE W REFLEX MICROSCOPIC - Abnormal; Notable for the following components:   Hgb urine dipstick SMALL (*)    Protein, ur TRACE (*)    Leukocytes,Ua SMALL (*)    All other components within normal limits  CBG MONITORING, ED - Abnormal; Notable for the following components:   Glucose-Capillary 156 (*)    All other components within normal limits  RESP PANEL BY RT-PCR (RSV, FLU A&B, COVID)  RVPGX2  PREGNANCY, URINE    EKG None  Radiology No results found.  Procedures Procedures    Medications Ordered in ED Medications  sodium chloride 0.9 % bolus 1,000 mL (1,000 mLs Intravenous New Bag/Given 11/20/22 1401)  ondansetron (ZOFRAN) injection 4 mg (4 mg Intravenous Given 11/20/22 1402)    ED Course/ Medical Decision Making/ A&P                             Medical Decision Making Amount and/or Complexity of Data Reviewed Labs: ordered. Decision-making details documented in ED Course. Radiology: ordered and independent interpretation performed. Decision-making details documented in ED  Course. ECG/medicine tests: ordered and independent interpretation performed. Decision-making details documented in ED Course.  Risk Prescription drug management.   Nausea, vomiting with upper abdominal pain since this morning.  No fever.  Ill-appearing but stable vitals and nontoxic.  No peritoneal signs.  Given IV fluids and antiemetics.  Labs are reassuring.  hCG is negative.  Does have leukocytosis of 12.  Normal LFTs and lipase  With ongoing pain, nausea, leukocytosis will obtain CT imaging to rule out  appendicitis or other acute surgical pathology.  Still favor gastroenteritis or likely foodborne illness.  Care to be transferred at shift change.         Final Clinical Impression(s) / ED Diagnoses Final diagnoses:  None    Rx / DC Orders ED Discharge Orders     None         Sherrine Salberg, Annie Main, MD 11/20/22 1556

## 2022-11-20 NOTE — ED Provider Notes (Addendum)
  Physical Exam  BP (!) 133/117 (BP Location: Left Arm)   Pulse 69   Temp 97.6 F (36.4 C) (Oral)   Resp 18   Ht 5\' 3"  (1.6 m)   Wt 49.9 kg   SpO2 99%   BMI 19.49 kg/m     Procedures  Procedures  ED Course / MDM    Medical Decision Making Amount and/or Complexity of Data Reviewed Labs: ordered. Radiology: ordered.  Risk Prescription drug management.   45F presenting with sx of likely gastroenteritis. Getting IVF and antiemetics.  On repeat assessment, the patient was tolerating oral intake.  Her abdomen was soft, nontender, nondistended.  Her symptoms of nausea and discomfort were resolved.  Her CT abdomen pelvis was without acute abnormality.  Her laboratory workup was significant for urinalysis with small hemoglobin, trace proteinuria and small leukocytes, no bacteria seen, negative nitrites, 16 WBCs, unconvincing for UTI and patient without symptoms. Will discharge on course of Zofran, advised continued oral fluid resuscitation outpatient.       Regan Lemming, MD 11/20/22 1652    Regan Lemming, MD 11/20/22 [id number]

## 2022-11-20 NOTE — Discharge Instructions (Addendum)
Recommend continued oral rehydration with electrolyte containing solutions like Gatorade and Pedialyte, Zofran has been prescribed for nausea.  Return due to any severe worsening of your symptoms, return for any concern for inability to tolerate oral intake and concern for dehydration

## 2022-11-22 ENCOUNTER — Observation Stay (HOSPITAL_BASED_OUTPATIENT_CLINIC_OR_DEPARTMENT_OTHER)
Admission: EM | Admit: 2022-11-22 | Discharge: 2022-11-23 | Disposition: A | Discharge status: Home / Self Care | Payer: Medicaid Other | Attending: Emergency Medicine | Admitting: Emergency Medicine

## 2022-11-22 ENCOUNTER — Encounter (HOSPITAL_BASED_OUTPATIENT_CLINIC_OR_DEPARTMENT_OTHER): Payer: Self-pay | Admitting: *Deleted

## 2022-11-22 ENCOUNTER — Emergency Department (HOSPITAL_BASED_OUTPATIENT_CLINIC_OR_DEPARTMENT_OTHER): Payer: Medicaid Other

## 2022-11-22 ENCOUNTER — Other Ambulatory Visit: Payer: Self-pay

## 2022-11-22 DIAGNOSIS — Z79899 Other long term (current) drug therapy: Secondary | ICD-10-CM | POA: Diagnosis not present

## 2022-11-22 DIAGNOSIS — Z87891 Personal history of nicotine dependence: Secondary | ICD-10-CM

## 2022-11-22 DIAGNOSIS — D649 Anemia, unspecified: Secondary | ICD-10-CM | POA: Diagnosis not present

## 2022-11-22 DIAGNOSIS — R932 Abnormal findings on diagnostic imaging of liver and biliary tract: Secondary | ICD-10-CM | POA: Insufficient documentation

## 2022-11-22 DIAGNOSIS — F909 Attention-deficit hyperactivity disorder, unspecified type: Secondary | ICD-10-CM | POA: Diagnosis present

## 2022-11-22 DIAGNOSIS — R1011 Right upper quadrant pain: Secondary | ICD-10-CM | POA: Diagnosis not present

## 2022-11-22 DIAGNOSIS — R112 Nausea with vomiting, unspecified: Secondary | ICD-10-CM

## 2022-11-22 DIAGNOSIS — E876 Hypokalemia: Secondary | ICD-10-CM | POA: Diagnosis not present

## 2022-11-22 DIAGNOSIS — D72829 Elevated white blood cell count, unspecified: Secondary | ICD-10-CM | POA: Insufficient documentation

## 2022-11-22 DIAGNOSIS — K810 Acute cholecystitis: Secondary | ICD-10-CM | POA: Diagnosis not present

## 2022-11-22 DIAGNOSIS — F411 Generalized anxiety disorder: Secondary | ICD-10-CM | POA: Diagnosis present

## 2022-11-22 DIAGNOSIS — R1013 Epigastric pain: Secondary | ICD-10-CM | POA: Diagnosis not present

## 2022-11-22 DIAGNOSIS — Z881 Allergy status to other antibiotic agents status: Secondary | ICD-10-CM

## 2022-11-22 DIAGNOSIS — Z820 Family history of epilepsy and other diseases of the nervous system: Secondary | ICD-10-CM

## 2022-11-22 DIAGNOSIS — E860 Dehydration: Secondary | ICD-10-CM | POA: Diagnosis present

## 2022-11-22 DIAGNOSIS — E872 Acidosis, unspecified: Secondary | ICD-10-CM | POA: Diagnosis present

## 2022-11-22 DIAGNOSIS — A6009 Herpesviral infection of other urogenital tract: Secondary | ICD-10-CM | POA: Diagnosis present

## 2022-11-22 LAB — CBC WITH DIFFERENTIAL/PLATELET
Abs Immature Granulocytes: 0.06 10*3/uL (ref 0.00–0.07)
Basophils Absolute: 0.1 10*3/uL (ref 0.0–0.1)
Basophils Relative: 0 %
Eosinophils Absolute: 0.1 10*3/uL (ref 0.0–0.5)
Eosinophils Relative: 1 %
HCT: 33.3 % — ABNORMAL LOW (ref 36.0–46.0)
Hemoglobin: 10.8 g/dL — ABNORMAL LOW (ref 12.0–15.0)
Immature Granulocytes: 0 %
Lymphocytes Relative: 19 %
Lymphs Abs: 2.8 10*3/uL (ref 0.7–4.0)
MCH: 27.5 pg (ref 26.0–34.0)
MCHC: 32.4 g/dL (ref 30.0–36.0)
MCV: 84.7 fL (ref 80.0–100.0)
Monocytes Absolute: 1.1 10*3/uL — ABNORMAL HIGH (ref 0.1–1.0)
Monocytes Relative: 8 %
Neutro Abs: 10.4 10*3/uL — ABNORMAL HIGH (ref 1.7–7.7)
Neutrophils Relative %: 72 %
Platelets: 312 10*3/uL (ref 150–400)
RBC: 3.93 MIL/uL (ref 3.87–5.11)
RDW: 16.1 % — ABNORMAL HIGH (ref 11.5–15.5)
WBC: 14.4 10*3/uL — ABNORMAL HIGH (ref 4.0–10.5)
nRBC: 0 % (ref 0.0–0.2)

## 2022-11-22 LAB — COMPREHENSIVE METABOLIC PANEL
ALT: 11 U/L (ref 0–44)
AST: 10 U/L — ABNORMAL LOW (ref 15–41)
Albumin: 4.4 g/dL (ref 3.5–5.0)
Alkaline Phosphatase: 37 U/L — ABNORMAL LOW (ref 38–126)
Anion gap: 11 (ref 5–15)
BUN: 16 mg/dL (ref 6–20)
CO2: 24 mmol/L (ref 22–32)
Calcium: 9 mg/dL (ref 8.9–10.3)
Chloride: 106 mmol/L (ref 98–111)
Creatinine, Ser: 0.9 mg/dL (ref 0.44–1.00)
GFR, Estimated: >60 mL/min (ref >60)
Glucose, Bld: 129 mg/dL — ABNORMAL HIGH (ref 70–99)
Potassium: 3.4 mmol/L — ABNORMAL LOW (ref 3.5–5.1)
Sodium: 141 mmol/L (ref 135–145)
Total Bilirubin: 0.3 mg/dL (ref 0.3–1.2)
Total Protein: 7.2 g/dL (ref 6.5–8.1)

## 2022-11-22 LAB — URINALYSIS, ROUTINE W REFLEX MICROSCOPIC
Bilirubin Urine: NEGATIVE
Glucose, UA: NEGATIVE mg/dL
Hgb urine dipstick: NEGATIVE
Ketones, ur: NEGATIVE mg/dL
Nitrite: NEGATIVE
Protein, ur: NEGATIVE mg/dL
Specific Gravity, Urine: 1.017 (ref 1.005–1.030)
pH: 6 (ref 5.0–8.0)

## 2022-11-22 LAB — LACTIC ACID, PLASMA
Lactic Acid, Venous: 2.4 mmol/L (ref 0.5–1.9)
Lactic Acid, Venous: 1.9 mmol/L (ref 0.5–1.9)

## 2022-11-22 LAB — LIPASE, BLOOD: Lipase: <10 U/L — ABNORMAL LOW (ref 11–51)

## 2022-11-22 MED ORDER — SODIUM CHLORIDE 0.9 % IV SOLN: 25.0000 mg | Freq: Once | INTRAVENOUS | Status: DC

## 2022-11-22 MED ORDER — ORAL CARE MOUTH RINSE: 15.0000 mL | OROMUCOSAL | Status: DC | PRN

## 2022-11-22 MED ORDER — METRONIDAZOLE 500 MG/100ML IV SOLN
500.0000 mg | Freq: Two times a day (BID) | INTRAVENOUS | Status: DC
  Administered 2022-11-22 – 2022-11-23 (×3): 500 mg via INTRAVENOUS
  Filled 2022-11-22 (×2): qty 100

## 2022-11-22 MED ORDER — SODIUM CHLORIDE 0.9 % IV BOLUS
1000.0000 mL | Freq: Once | INTRAVENOUS | Status: AC
  Administered 2022-11-22: 1000 mL via INTRAVENOUS

## 2022-11-22 MED ORDER — CIPROFLOXACIN IN D5W 400 MG/200ML IV SOLN
400.0000 mg | Freq: Two times a day (BID) | INTRAVENOUS | Status: DC
  Administered 2022-11-22 – 2022-11-23 (×2): 400 mg via INTRAVENOUS
  Filled 2022-11-22 (×4): qty 200

## 2022-11-22 MED ORDER — PROMETHAZINE HCL 25 MG/ML IJ SOLN
INTRAMUSCULAR | Status: AC
  Filled 2022-11-22: qty 1

## 2022-11-22 MED ORDER — DEXTROSE IN LACTATED RINGERS 5 % IV SOLN: INTRAVENOUS | Status: DC

## 2022-11-22 MED ORDER — PROCHLORPERAZINE EDISYLATE 10 MG/2ML IJ SOLN
10.0000 mg | Freq: Once | INTRAMUSCULAR | Status: AC
  Administered 2022-11-22: 10 mg via INTRAVENOUS
  Filled 2022-11-22: qty 2

## 2022-11-22 MED ORDER — SODIUM CHLORIDE 0.9 % IV SOLN
25.0000 mg | Freq: Once | INTRAVENOUS | Status: AC
  Administered 2022-11-22: 25 mg via INTRAVENOUS
  Filled 2022-11-22: qty 1

## 2022-11-22 MED ORDER — ONDANSETRON HCL 4 MG/2ML IJ SOLN
4.0000 mg | Freq: Four times a day (QID) | INTRAMUSCULAR | Status: DC | PRN
  Administered 2022-11-22: 4 mg via INTRAVENOUS
  Filled 2022-11-22: qty 2

## 2022-11-22 MED ORDER — ONDANSETRON HCL 4 MG PO TABS: 4.0000 mg | ORAL_TABLET | Freq: Four times a day (QID) | ORAL | Status: DC | PRN

## 2022-11-22 MED ORDER — MORPHINE SULFATE (PF) 4 MG/ML IV SOLN
4.0000 mg | Freq: Once | INTRAVENOUS | Status: AC
  Administered 2022-11-22: 4 mg via INTRAVENOUS
  Filled 2022-11-22: qty 1

## 2022-11-22 MED ORDER — HYDROMORPHONE HCL 1 MG/ML IJ SOLN
1.0000 mg | INTRAMUSCULAR | Status: DC | PRN
  Administered 2022-11-22: 1 mg via INTRAVENOUS
  Filled 2022-11-22: qty 1

## 2022-11-22 NOTE — ED Triage Notes (Signed)
Transfer from [facility] for concern for possible gall bladder issues.

## 2022-11-22 NOTE — H&P (Signed)
History and Physical    Patient: Denise [name] PIR:[id number] DOB: 1991-10-05 DOA: 11/22/2022 DOS: the patient was seen and examined on 11/22/2022 PCP: Maximiano Coss, NP  Patient coming from: Home  Chief Complaint:  Chief Complaint  Patient presents with   Emesis   Abdominal Pain   HPI: Denise [name] is a 32 y.o. female with medical history significant of ADHD, anxiety disorder, genital herpes, who presents from home with abdominal pain nausea vomiting.  Patient has had recurrent symptoms apparently in the past.  She was seen in the ER event 2 days ago.  She has had 6 visits where she was told she has gallbladder problems.  She came back today as the symptoms were getting worse.  Patient was found to have lactic acidosis.  Leukocytosis with a white count of 14.  Potassium 3.4.  CT abdomen pelvis showed no acute findings abdominal ultrasound showed abnormal gallbladder with thickened wall.  Suspected acute cholecystitis.  Surgery consulted and recommend HIDA scan and admission to the hospital for that.  Review of Systems: As mentioned in the history of present illness. All other systems reviewed and are negative. Past Medical History:  Diagnosis Date   ADHD (attention deficit hyperactivity disorder)    Anxiety    Genital herpes    Past Surgical History:  Procedure Laterality Date   NO PAST SURGERIES     Social History:  reports that she has never smoked. She has never used smokeless tobacco. She reports current alcohol use. She reports current drug use. Drug: Marijuana.  Allergies  Allergen Reactions   Cephalosporins Anaphylaxis and Hives    Family History  Problem Relation Age of Onset   Multiple sclerosis Mother    Breast cancer Mother 67   Skin cancer Father    Lung cancer Maternal Grandfather        smoker   Stroke Paternal Grandmother    Heart attack Paternal Grandfather    Multiple sclerosis Maternal Aunt    Breast cancer Maternal Aunt 54   Breast cancer  Maternal Aunt 54   Cancer Other    Stroke Other     Prior to Admission medications   Medication Sig Start Date End Date Taking? Authorizing Provider  FLUoxetine (PROZAC) 20 MG capsule Take 1 capsule (20 mg total) by mouth daily. 04/24/22  Yes Maximiano Coss, NP  norgestimate-ethinyl estradiol (ORTHO-CYCLEN) 0.25-35 MG-MCG tablet Take 1 tablet by mouth daily. 02/21/22  Yes [provider]  ondansetron (ZOFRAN-ODT) 4 MG disintegrating tablet Take 1 tablet (4 mg total) by mouth every 8 (eight) hours as needed for nausea or vomiting. 11/20/22  Yes Rancour, Annie Main, MD  Ubrogepant (UBRELVY) 100 MG TABS Take 100 mg by mouth every 2 (two) hours as needed. Maximum 200mg  a day. 05/31/22  Yes Melvenia Beam, MD  valACYclovir (VALTREX) 500 MG tablet Take 1 tablet (500 mg total) by mouth 2 (two) times daily. 03/13/22  Yes Maximiano Coss, NP  sulfamethoxazole-trimethoprim (BACTRIM DS) 800-160 MG tablet Take 1 tablet by mouth 2 (two) times daily. Patient not taking: Reported on 11/22/2022 04/24/22   Maximiano Coss, NP  Ubrogepant (UBRELVY) 100 MG TABS Take 100 mg by mouth every 2 (two) hours as needed. Maximum 200mg  a day. 06/04/22   Melvenia Beam, MD    Physical Exam: Vitals:   11/22/22 1200 11/22/22 1230 11/22/22 1430 11/22/22 1534  BP: 109/67 108/62 128/77 114/76  Pulse: 70 66 74 72  Resp: 16 16 16 16   Temp:  99.3 F (37.4 C) 99.4 F (37.4 C)  TempSrc:    Oral  SpO2: 100% 99% 100% 100%  Weight:       Constitutional: NAD, calm, comfortable Eyes: PERRL, lids and conjunctivae normal ENMT: Mucous membranes are moist. Posterior pharynx clear of any exudate or lesions.Normal dentition.  Neck: normal, supple, no masses, no thyromegaly Respiratory: clear to auscultation bilaterally, no wheezing, no crackles. Normal respiratory effort. No accessory muscle use.  Cardiovascular: Regular rate and rhythm, no murmurs / rubs / gallops. No extremity edema. 2+ pedal pulses. No carotid bruits.   Abdomen: Right upper quadrant abdominal tenderness, no masses palpated. No hepatosplenomegaly. Bowel sounds positive.  Musculoskeletal: Good range of motion, no joint swelling or tenderness, Skin: no rashes, lesions, ulcers. No induration Neurologic: CN 2-12 grossly intact. Sensation intact, DTR normal. Strength 5/5 in all 4.  Psychiatric: Normal judgment and insight. Alert and oriented x 3. Normal mood  Data Reviewed:  Potassium is 3.4 otherwise LFTs within normal.  Abdominal ultrasound showed abnormal gallbladder with thickened wall.  Acute viral screen has been negative.  Lactic acid 2.4.  White count 14.4 and hemoglobin 10.8.  Assessment and Plan:   #1 suspected acute cholecystitis: Patient will be admitted.  Pain control and nausea management.  HIDA scan will be ordered.  Surgery already consulted.  We will defer to surgery after HIDA scan.  #2 hypokalemia: Replete potassium.  #3 leukocytosis: Most likely due to inflammation.  Also dehydration.  Hydrate and treat cholecystitis empirically.  #4 history of ADHD: Defer home treatment for now    Advance Care Planning:   Code Status: Full Code   Consults: General surgery,  Surgery PA (B. Meuth)   Family Communication: Significant other at bedside  Severity of Illness: The appropriate patient status for this patient is INPATIENT. Inpatient status is judged to be reasonable and necessary in order to provide the required intensity of service to ensure the patient's safety. The patient's presenting symptoms, physical exam findings, and initial radiographic and laboratory data in the context of their chronic comorbidities is felt to place them at high risk for further clinical deterioration. Furthermore, it is not anticipated that the patient will be medically stable for discharge from the hospital within 2 midnights of admission.   * I certify that at the point of admission it is my clinical judgment that the patient will require  inpatient hospital care spanning beyond 2 midnights from the point of admission due to high intensity of service, high risk for further deterioration and high frequency of surveillance required.*  AuthorBarbette Merino, MD 11/22/2022 7:01 PM  For on call review [email or web address].

## 2022-11-22 NOTE — ED Triage Notes (Signed)
BIB family from home, returns for continued/ recurrent NV, also reports chills. No relief with zofran. Describes as clear. Alert, NAD, calm, interactive. Denies fever, syncope, diarrhea, or pain.

## 2022-11-22 NOTE — ED Notes (Signed)
Patient requesting update about plan of care. Explained to patient current plan of care. Patient verbalized understanding and reports willing to stay.

## 2022-11-22 NOTE — ED Notes (Signed)
Thayer Headings at [facility] will send transport to [facility] ED. Dr. Gilford Raid is accepting.-ABB(NS)

## 2022-11-22 NOTE — Progress Notes (Signed)
Patient arrived on unit at 2130 with fiancee at bedside.  Patient is alert and oriented x 4.  Complained of nausea.  Denied vomiting since this AM.  7/10 pain at center of ABD, non radiating. NPO status explained.  No additional questions or concerns at this time. Continue to monitor.

## 2022-11-22 NOTE — ED Notes (Signed)
ED TO INPATIENT HANDOFF REPORT  ED Nurse Name and Phone #: Mosie Lukes RN [phone]  S Name/Age/Gender Denise [name] 32 y.o. female Room/Bed: 039C/039C  Code Status   Code Status: Full Code  Home/SNF/Other Home Patient oriented to: self, place, time, and situation Is this baseline? Yes   Triage Complete: Triage complete  Chief Complaint Acute cholecystitis [K81.0]  Triage Note BIB family from home, returns for continued/ recurrent NV, also reports chills. No relief with zofran. Describes as clear. Alert, NAD, calm, interactive. Denies fever, syncope, diarrhea, or pain.    [facility] for concern for possible gall bladder issues.   Allergies Allergies  Allergen Reactions   Cephalosporins Anaphylaxis and Hives    Level of Care/Admitting Diagnosis ED Disposition     ED Disposition  Admit   Condition  --   Comment  Hospital Area: [facility] [[id number]] Level of Care: Med-Surg [16] May admit patient to [facility] or [facility] if equivalent level of care is available:: No Covid Evaluation: Asymptomatic - no recent exposure (last 10 days) testi ng not required Diagnosis: Acute cholecystitis [575.0.ICD-9-CM] Admitting Physician: Elwyn Reach [[id number]] Attending Physician: Elwyn Reach [[id number]] Certification:: I certify this patient will need inpatient services for at least 2 midnigh ts Estimated Length of Stay: 3          B Medical/Surgery History Past Medical History:  Diagnosis Date   ADHD (attention deficit hyperactivity disorder)    Anxiety    Genital herpes    Past Surgical History:  Procedure Laterality Date   NO PAST SURGERIES       A IV Location/Drains/Wounds Patient Lines/Drains/Airways Status     Active Line/Drains/Airways     Name Placement date Placement time Site Days   Peripheral IV 11/22/22 18 G Left Antecubital 11/22/22  0816  Antecubital  less than 1            Intake/Output Last 24  hours  Intake/Output Summary (Last 24 hours) at 11/22/2022 2052 Last data filed at 11/22/2022 8341 Gross per 24 hour  Intake 2050.3 ml  Output --  Net 2050.3 ml    Labs/Imaging Results for orders placed or performed during the hospital encounter of 11/22/22 (from the past 48 hour(s))  CBC with Differential     Status: Abnormal   Collection Time: 11/22/22  8:25 AM  Result Value Ref Range   WBC 14.4 (H) 4.0 - 10.5 K/uL   RBC 3.93 3.87 - 5.11 MIL/uL   Hemoglobin 10.8 (L) 12.0 - 15.0 g/dL   HCT 33.3 (L) 36.0 - 46.0 %   MCV 84.7 80.0 - 100.0 fL   MCH 27.5 26.0 - 34.0 pg   MCHC 32.4 30.0 - 36.0 g/dL   RDW 16.1 (H) 11.5 - 15.5 %   Platelets 312 150 - 400 K/uL   nRBC 0.0 0.0 - 0.2 %   Neutrophils Relative % 72 %   Neutro Abs 10.4 (H) 1.7 - 7.7 K/uL   Lymphocytes Relative 19 %   Lymphs Abs 2.8 0.7 - 4.0 K/uL   Monocytes Relative 8 %   Monocytes Absolute 1.1 (H) 0.1 - 1.0 K/uL   Eosinophils Relative 1 %   Eosinophils Absolute 0.1 0.0 - 0.5 K/uL   Basophils Relative 0 %   Basophils Absolute 0.1 0.0 - 0.1 K/uL   Immature Granulocytes 0 %   Abs Immature Granulocytes 0.06 0.00 - 0.07 K/uL    Comment: Performed at [facility],  [address], [place], [place] [address]  Comprehensive metabolic panel     Status: Abnormal   Collection Time: 11/22/22  8:25 AM  Result Value Ref Range   Sodium 141 135 - 145 mmol/L   Potassium 3.4 (L) 3.5 - 5.1 mmol/L   Chloride 106 98 - 111 mmol/L   CO2 24 22 - 32 mmol/L   Glucose, Bld 129 (H) 70 - 99 mg/dL    Comment: Glucose reference range applies only to samples taken after fasting for at least 8 hours.   BUN 16 6 - 20 mg/dL   Creatinine, Ser 0.90 0.44 - 1.00 mg/dL   Calcium 9.0 8.9 - 10.3 mg/dL   Total Protein 7.2 6.5 - 8.1 g/dL   Albumin 4.4 3.5 - 5.0 g/dL   AST 10 (L) 15 - 41 U/L   ALT 11 0 - 44 U/L   Alkaline Phosphatase 37 (L) 38 - 126 U/L   Total Bilirubin 0.3 0.3 - 1.2 mg/dL   GFR, Estimated >60 >60 mL/min     Comment: (NOTE) Calculated using the CKD-EPI Creatinine Equation (2021)    Anion gap 11 5 - 15    Comment: Performed at [facility], [place], [place] [address]  Lipase, blood     Status: Abnormal   Collection Time: 11/22/22  8:25 AM  Result Value Ref Range   Lipase <10 (L) 11 - 51 U/L    Comment: Performed at [facility], [address], [place], [place] [address]  Lactic acid, plasma     Status: Abnormal   Collection Time: 11/22/22  8:25 AM  Result Value Ref Range   Lactic Acid, Venous 2.4 (HH) 0.5 - 1.9 mmol/L    Comment: CRITICAL RESULT CALLED TO, READ BACK BY AND VERIFIED WITH: Corliss Parish [id number] 11/22/2022 DBRADLEY Performed at [facility], [address], [place], [place] [address]   Lactic acid, plasma     Status: None   Collection Time: 11/22/22  8:25 AM  Result Value Ref Range   Lactic Acid, Venous 1.9 0.5 - 1.9 mmol/L    Comment: Performed at [facility], [address], [place], [place] [address]  Urinalysis, Routine w reflex microscopic -     Status: Abnormal   Collection Time: 11/22/22  8:25 AM  Result Value Ref Range   Color, Urine YELLOW YELLOW   APPearance CLEAR CLEAR   Specific Gravity, Urine 1.017 1.005 - 1.030   pH 6.0 5.0 - 8.0   Glucose, UA NEGATIVE NEGATIVE mg/dL   Hgb urine dipstick NEGATIVE NEGATIVE   Bilirubin Urine NEGATIVE NEGATIVE   Ketones, ur NEGATIVE NEGATIVE mg/dL   Protein, ur NEGATIVE NEGATIVE mg/dL   Nitrite NEGATIVE NEGATIVE   Leukocytes,Ua MODERATE (A) NEGATIVE   RBC / HPF 0-5 0 - 5 RBC/hpf   WBC, UA 6-10 0 - 5 WBC/hpf   Bacteria, UA FEW (A) NONE SEEN   Squamous Epithelial / HPF 11-20 0 - 5 /HPF   Mucus PRESENT     Comment: Performed at [facility], [address] [place], [place] [address]   US Abdomen Limited RUQ (LIVER/GB)  Result Date: 11/22/2022 CLINICAL DATA:  [id number] Epigastric abdominal pain 114841 EXAM:  ULTRASOUND ABDOMEN LIMITED COMPARISON:  None Available. FINDINGS: The liver demonstrates normal parenchymal echogenicity and homogeneous texture without focal hepatic parenchymal lesions or intrahepatic ductal dilatation. Gallbladder wall is thickened and edematous. No pericholecystic fluid or shadowing stones identified. CBD measured 0.2cm. IMPRESSION: Abnormal gallbladder with  an edematous thickened wall. If indicated, to evaluate for cholecystitis, consider HIDA scan. Otherwise unremarkable right upper quadrant. Electronically Signed   By: Layla Maw M.D.   On: 11/22/2022 09:26    Pending Labs Unresulted Labs (From admission, onward)     Start     Ordered   11/22/22 0820  Urine Culture  Once,   URGENT       Question:  Indication  Answer:  Suprapubic pain   11/22/22 0820   Signed and Held  HIV Antibody (routine testing w rflx)  (HIV Antibody (Routine testing w reflex) panel)  Once,   R        Signed and Held   Signed and Held  CBC  Tomorrow morning,   R        Signed and Held   Signed and Held  Comprehensive metabolic panel  Tomorrow morning,   R        Signed and Held            Vitals/Pain Today's Vitals   11/22/22 1422 11/22/22 1430 11/22/22 1534 11/22/22 2034  BP:  128/77 114/76   Pulse:  74 72   Resp:  16 16   Temp:  99.3 F (37.4 C) 99.4 F (37.4 C) 98.1 F (36.7 C)  TempSrc:   Oral Oral  SpO2:  100% 100%   Weight:      PainSc: 0-No pain       Isolation Precautions No active isolations  Medications Medications  promethazine (PHENERGAN) 25 MG/ML injection (  Not Given 11/22/22 0851)  promethazine (PHENERGAN) 25 MG/ML injection (  Not Given 11/22/22 1112)  ciprofloxacin (CIPRO) IVPB 400 mg (0 mg Intravenous Stopped 11/22/22 1733)  metroNIDAZOLE (FLAGYL) IVPB 500 mg (0 mg Intravenous Stopped 11/22/22 1733)  sodium chloride 0.9 % bolus 1,000 mL (0 mLs Intravenous Stopped 11/22/22 0938)  sodium chloride 0.9 % bolus 1,000 mL (0 mLs Intravenous Stopped 11/22/22 0938)   promethazine (PHENERGAN) 25 mg in sodium chloride 0.9 % 50 mL IVPB (0 mg Intravenous Stopped 11/22/22 0938)  morphine (PF) 4 MG/ML injection 4 mg (4 mg Intravenous Given 11/22/22 1113)  prochlorperazine (COMPAZINE) injection 10 mg (10 mg Intravenous Given 11/22/22 1113)    Mobility walks     Focused Assessments Neuro Assessment Handoff:  Swallow screen pass? Yes          Neuro Assessment:   Neuro Checks:      Has TPA been given? No If patient is a Neuro Trauma and patient is going to OR before floor call report to 4N Charge nurse: [phone] or [phone]   R Recommendations: See Admitting Provider Note  Report given to:   Additional Notes: Patient is A&O x 4, ambulates without assistance. Has been NPO since 0800 this am. Patient would like further update about when HIDA scan will take place. Patient also expresses fear of anaesthesia for potential gallbladder surgery.  Patient's husband is at bedside.

## 2022-11-22 NOTE — ED Provider Notes (Signed)
[place] EMERGENCY DEPARTMENT AT [facility] Provider Note   CSN: [id number] Arrival date & time: 11/22/22  3810     History  No chief complaint on file.   Denise [name] is a 32 y.o. female.  The history is provided by the patient, a relative and medical records. No language interpreter was used.  Emesis Severity:  Severe Duration:  3 days Timing:  Constant Number of daily episodes:  Too numerous to count Quality:  Stomach contents Progression:  Unchanged Chronicity:  Recurrent Recent urination:  Normal Relieved by:  Nothing Worsened by:  Nothing Ineffective treatments:  Antiemetics Associated symptoms: abdominal pain   Associated symptoms: no arthralgias, no chills, no cough, no diarrhea, no fever, no headaches, no sore throat and no URI   Risk factors: not pregnant, no prior abdominal surgery, no sick contacts and no suspect food intake        Home Medications Prior to Admission medications   Medication Sig Start Date End Date Taking? Authorizing Provider  FLUoxetine (PROZAC) 20 MG capsule Take 1 capsule (20 mg total) by mouth daily. 04/24/22   Janeece Agee, NP  norgestimate-ethinyl estradiol (ORTHO-CYCLEN) 0.25-35 MG-MCG tablet Take 1 tablet by mouth daily. 02/21/22   [provider]  ondansetron (ZOFRAN-ODT) 4 MG disintegrating tablet Take 1 tablet (4 mg total) by mouth every 8 (eight) hours as needed for nausea or vomiting. 11/20/22   Rancour, Jeannett Senior, MD  sulfamethoxazole-trimethoprim (BACTRIM DS) 800-160 MG tablet Take 1 tablet by mouth 2 (two) times daily. 04/24/22   Janeece Agee, NP  Ubrogepant (UBRELVY) 100 MG TABS Take 100 mg by mouth every 2 (two) hours as needed. Maximum 200mg  a day. 05/31/22   07/31/22, MD  Ubrogepant (UBRELVY) 100 MG TABS Take 100 mg by mouth every 2 (two) hours as needed. Maximum 200mg  a day. 06/04/22   , MD  valACYclovir (VALTREX) 500 MG tablet Take 1 tablet (500 mg total) by mouth 2 (two) times  daily. 03/13/22   Anson Fret, NP      Allergies    Cephalosporins    Review of Systems   Review of Systems  Constitutional:  Negative for chills, diaphoresis, fatigue and fever.  HENT:  Negative for congestion and sore throat.   Eyes:  Negative for visual disturbance.  Respiratory:  Negative for cough, chest tightness, shortness of breath and wheezing.   Cardiovascular:  Negative for chest pain and palpitations.  Gastrointestinal:  Positive for abdominal pain, nausea and vomiting. Negative for constipation and diarrhea.  Genitourinary:  Negative for dysuria and flank pain.  Musculoskeletal:  Negative for arthralgias and neck pain.  Skin:  Negative for rash.  Neurological:  Negative for headaches.  Psychiatric/Behavioral:  Negative for agitation.   All other systems reviewed and are negative.   Physical Exam Updated Vital Signs BP (!) 143/94   Pulse 75   Temp 97.9 F (36.6 C)   Resp 17   Wt 49.9 kg   LMP 11/13/2022 (Exact Date)   SpO2 100%   BMI 19.49 kg/m  Physical Exam Vitals and nursing note reviewed.  Constitutional:      General: She is not in acute distress.    Appearance: She is well-developed. She is not ill-appearing, toxic-appearing or diaphoretic.  HENT:     Head: Normocephalic and atraumatic.     Nose: Nose normal.     Mouth/Throat:     Mouth: Mucous membranes are dry.  Eyes:     Extraocular Movements: Extraocular  movements intact.     Conjunctiva/sclera: Conjunctivae normal.     Pupils: Pupils are equal, round, and reactive to light.  Cardiovascular:     Rate and Rhythm: Normal rate and regular rhythm.     Heart sounds: No murmur heard. Pulmonary:     Effort: Pulmonary effort is normal. No respiratory distress.     Breath sounds: Normal breath sounds. No wheezing, rhonchi or rales.  Chest:     Chest wall: No tenderness.  Abdominal:     General: Abdomen is flat. There is no distension.     Palpations: Abdomen is soft.     Tenderness: There is  abdominal tenderness in the right upper quadrant, epigastric area and left upper quadrant. There is no right CVA tenderness, left CVA tenderness, guarding or rebound.    Musculoskeletal:        General: No swelling or tenderness.     Cervical back: Neck supple.     Right lower leg: No edema.     Left lower leg: No edema.  Skin:    General: Skin is warm and dry.     Capillary Refill: Capillary refill takes less than 2 seconds.     Findings: No erythema.  Neurological:     General: No focal deficit present.     Mental Status: She is alert.  Psychiatric:        Mood and Affect: Mood normal.     ED Results / Procedures / Treatments   Labs (all labs ordered are listed, but only abnormal results are displayed) Labs Reviewed  CBC WITH DIFFERENTIAL/PLATELET - Abnormal; Notable for the following components:      Result Value   WBC 14.4 (*)    Hemoglobin 10.8 (*)    HCT 33.3 (*)    RDW 16.1 (*)    Neutro Abs 10.4 (*)    Monocytes Absolute 1.1 (*)    All other components within normal limits  COMPREHENSIVE METABOLIC PANEL - Abnormal; Notable for the following components:   Potassium 3.4 (*)    Glucose, Bld 129 (*)    AST 10 (*)    Alkaline Phosphatase 37 (*)    All other components within normal limits  LIPASE, BLOOD - Abnormal; Notable for the following components:   Lipase <10 (*)    All other components within normal limits  LACTIC ACID, PLASMA - Abnormal; Notable for the following components:   Lactic Acid, Venous 2.4 (*)    All other components within normal limits  URINE CULTURE  LACTIC ACID, PLASMA  URINALYSIS, ROUTINE W REFLEX MICROSCOPIC    EKG None  Radiology US Abdomen Limited RUQ (LIVER/GB)  Result Date: 11/22/2022 CLINICAL DATA:  [id number] Epigastric abdominal pain 114841 EXAM: ULTRASOUND ABDOMEN LIMITED COMPARISON:  None Available. FINDINGS: The liver demonstrates normal parenchymal echogenicity and homogeneous texture without focal hepatic parenchymal lesions  or intrahepatic ductal dilatation. Gallbladder wall is thickened and edematous. No pericholecystic fluid or shadowing stones identified. CBD measured 0.2cm. IMPRESSION: Abnormal gallbladder with an edematous thickened wall. If indicated, to evaluate for cholecystitis, consider HIDA scan. Otherwise unremarkable right upper quadrant. Electronically Signed   By: Sammie Bench M.D.   On: 11/22/2022 09:26   CT ABDOMEN PELVIS W CONTRAST  Result Date: 11/20/2022 CLINICAL DATA:  Abdominal pain, acute, nonlocalized EXAM: CT ABDOMEN AND PELVIS WITH CONTRAST TECHNIQUE: Multidetector CT imaging of the abdomen and pelvis was performed using the standard protocol following bolus administration of intravenous contrast. RADIATION DOSE REDUCTION: This exam was performed  according to the departmental dose-optimization program which includes automated exposure control, adjustment of the mA and/or kV according to patient size and/or use of iterative reconstruction technique. CONTRAST:  26mL OMNIPAQUE IOHEXOL 300 MG/ML  SOLN COMPARISON:  None Available. FINDINGS: Lower chest: No acute abnormality. Hepatobiliary: No focal liver abnormality is seen. The gallbladder is unremarkable. Pancreas: Unremarkable. No pancreatic ductal dilatation or surrounding inflammatory changes. Spleen: Normal in size without focal abnormality. Adrenals/Urinary Tract: Adrenal glands are unremarkable. No hydronephrosis or nephrolithiasis. Bladder is unremarkable. Stomach/Bowel: The stomach is within normal limits. There is no evidence of bowel obstruction.The appendix is normal. Vascular/Lymphatic: No significant vascular findings are present. No enlarged abdominal or pelvic lymph nodes. Reproductive: Unremarkable for age. Other: Trace free fluid in the pelvis, nonspecific but likely physiologic. Musculoskeletal: No acute or significant osseous findings. IMPRESSION: No acute findings in the abdomen or pelvis.  Normal appendix. Electronically Signed   By:  Maurine Simmering M.D.   On: 11/20/2022 16:17    Procedures Procedures    Medications Ordered in ED Medications  promethazine (PHENERGAN) 25 MG/ML injection (  Not Given 11/22/22 0851)  promethazine (PHENERGAN) 25 mg in sodium chloride 0.9 % 50 mL IVPB (has no administration in time range)  morphine (PF) 4 MG/ML injection 4 mg (has no administration in time range)  sodium chloride 0.9 % bolus 1,000 mL (0 mLs Intravenous Stopped 11/22/22 0938)  sodium chloride 0.9 % bolus 1,000 mL (0 mLs Intravenous Stopped 11/22/22 0938)  promethazine (PHENERGAN) 25 mg in sodium chloride 0.9 % 50 mL IVPB (0 mg Intravenous Stopped 11/22/22 9323)    ED Course/ Medical Decision Making/ A&P                             Medical Decision Making Amount and/or Complexity of Data Reviewed Labs: ordered. Radiology: ordered.  Risk Prescription drug management.    CHANCI OJALA is a 32 y.o. female with a past medical history significant for ADHD, migraines, anxiety, who presents with persistent nausea, vomiting, and abdominal pain.  According to patient, for the last 3 days she has had persistent nausea and vomiting and abdominal discomfort.  She reports she came to the hospital 2 days ago where she was seen and had a reassuring workup in regards to labs CT imaging and urinalysis.  She was sent home with nausea medicine after rehydration and feel better for a brief time before worsening again.  She reports this happened a year or 2 ago and was due to a viral gastroenteritis.  Patient says that she is having abdominal cramping in her epigastric and right upper quadrant area that is up to a an 8 out of 10 in severity and is still persistent.  She has not had much to eat or drink in the last 2 days due to the persistent symptoms.  She reports normal bowel movements and denies urinary changes but is having the abdominal discomfort.  She denies other fevers, chills, chest pain, shortness of breath, cough, or URI symptoms.  She  just wants help with the nausea and vomiting.  She denies marijuana abuse or any recent medication changes.  On exam, lungs clear and chest nontender.  Abdomen is tender primarily in the epigastric and right upper quadrant area.  Left lower quadrant also slightly tender.  Lower abdomen was less tender.  Bowel sounds are appreciated.  Back and flanks nontender.  Patient has dry mucous membranes and is actively retching  and dry heaving on exam.  Given the patient's recent CT scan we had a shared decision-making conversation and agreed to hold on repeat CT however we will get an ultrasound to make sure there is not a callus cholecystitis or some other gallbladder abnormality that was missed on CT.  Will get repeat labs due to the persistent GI symptoms and will give her some fluids, pain medicine, and nausea medicine.  Will keep her n.p.o. initially.  Will get repeat urinalysis as there were some evidence of white blood cells but was not convincing for UTI on initial urinalysis.  Anticipate reassessment after workup to determine disposition.  She was negative for pregnancy 2 days ago so we will hold on repeat testing at this time.  Anticipate reassessment.  10:24 AM Patient reassessed and continues to have severe pain in her right upper quadrant and epigastric area with nausea and vomiting.  She will be given more pain medicine and nausea medicine.  Her workup began to return and her lactic acid is elevated and her white count is rising.  Her LFTs remain low however I am clinically concerned about a gallbladder etiology.  Ultrasound shows abnormal gallbladder with some wall thickening although there is no convincing stones.  I spoke to general surgery who agrees that this sounds like acute cholecystitis albeit early.  They would like her to go ED to ED transfer to [facility] so that she can get an HIDA scan and have them lay hands on her abdomen to determine if she needs to get the HIDA versus get a  cholecystectomy.  Will call and get a provider in transfer and they will transfer.  Spoke to Dr. Gilford Raid to accept the patient for ED transfer to [facility].  HIDA scan was ordered per general surgery request.  She will be transferred via [employer].  Patient agrees with plan of care and will be transferred.        Final Clinical Impression(s) / ED Diagnoses Final diagnoses:  RUQ abdominal pain  Abnormal findings on diagnostic imaging of gall bladder  Nausea and vomiting, unspecified vomiting type     Clinical Impression: 1. RUQ abdominal pain   2. Abnormal findings on diagnostic imaging of gall bladder   3. Nausea and vomiting, unspecified vomiting type     Disposition: ED to ED transfer to [facility] emergency department accepted by Dr. Gilford Raid to be seen by general surgery for further evaluation of her gallbladder.  This note was prepared with assistance of Systems analyst. Occasional wrong-word or sound-a-like substitutions may have occurred due to the inherent limitations of voice recognition software.      Amaryllis Malmquist, Gwenyth Allegra, MD 11/22/22 [phone]

## 2022-11-22 NOTE — ED Notes (Signed)
Thomas at [facility] will send transport for DB to [facility] transfer. Dr. Gilford Raid accepting patient for Gallbladder Surgery.-ABB(NS)

## 2022-11-22 NOTE — Consult Note (Signed)
[facility] Surgery Consult Note  Denise [name] 10-22-1991  [id number].    Requesting MD: Marda Stalker Chief Complaint/Reason for Consult: RUQ abdominal pain  HPI:  Denise [name] is a 32 y.o. female PMH anxiety/depression who presented to [facility] today complaining of worsening abdominal pain, nausea, and vomiting. She has had several similar episodes over the last year. This episode began 2-3 days ago. She was seen in the ED 11/20/22 and had a CT scan with no acute findings. Complaining of epigastric abdominal pain. Pain is not associated with PO intake and associated with multiple episodes of nausea and vomiting. Denies fever, chills, diarrhea, dysuria, recent sick contacts, or suspect food intake. Worked up in the ED today with lab work revealing WBC 14.4, LFTs and lipase WNL, lactic acid 2.4>>1.9. RUQ u/s shows no gallstones, abnormal gallbladder with an edematous thickened wall. Transferred to [facility] for evaluation by general surgery.  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    Family History  Problem Relation Age of Onset   Multiple sclerosis Mother    Breast cancer Mother 22   Skin cancer Father    Lung cancer Maternal Grandfather        smoker   Stroke Paternal Grandmother    Heart attack Paternal Grandfather    Multiple sclerosis Maternal Aunt    Breast cancer Maternal Aunt 54   Breast cancer Maternal Aunt 54   Cancer Other    Stroke Other     Past Medical History:  Diagnosis Date   ADHD (attention deficit hyperactivity disorder)    Anxiety    Genital herpes     Past Surgical History:  Procedure Laterality Date   NO PAST SURGERIES      Social History:  reports that she has never smoked. She has never used smokeless tobacco. She reports current alcohol use. She reports current drug use.  Drug: Marijuana.  Allergies:  Allergies  Allergen Reactions   Cephalosporins Anaphylaxis and Hives    (Not in a hospital admission)   Prior to Admission medications   Medication Sig Start Date End Date Taking? Authorizing Provider  FLUoxetine (PROZAC) 20 MG capsule Take 1 capsule (20 mg total) by mouth daily. 04/24/22  Yes Maximiano Coss, NP  norgestimate-ethinyl estradiol (ORTHO-CYCLEN) 0.25-35 MG-MCG tablet Take 1 tablet by mouth daily. 02/21/22  Yes [provider]  ondansetron (ZOFRAN-ODT) 4 MG disintegrating tablet Take 1 tablet (4 mg total) by mouth every 8 (eight) hours as needed for nausea or vomiting. 11/20/22  Yes Rancour, Annie Main, MD  Ubrogepant (UBRELVY) 100 MG TABS Take 100 mg by mouth every 2 (two) hours as needed. Maximum 200mg  a day. 05/31/22  Yes Melvenia Beam, MD  valACYclovir (VALTREX) 500 MG tablet Take 1 tablet (500 mg total) by mouth 2 (two) times daily. 03/13/22  Yes Maximiano Coss, NP  sulfamethoxazole-trimethoprim (BACTRIM DS) 800-160 MG tablet Take 1 tablet by mouth 2 (two) times daily. Patient not taking: Reported on 11/22/2022 04/24/22   Maximiano Coss, NP  Ubrogepant (UBRELVY) 100 MG TABS Take 100 mg by mouth every 2 (two) hours as needed. Maximum 200mg  a day. 06/04/22   Melvenia Beam, MD    Blood pressure 128/77, pulse 74, temperature 99.3 F (37.4 C), resp. rate 16, weight 49.9 kg, last menstrual period 11/13/2022, SpO2 100 %, unknown if currently breastfeeding. Physical Exam: General: pleasant, WD/WN  female who is laying in bed in NAD HEENT: head is normocephalic, atraumatic.  Sclera are noninjected.  Pupils equal and round.  Ears and nose without any masses or lesions.  Mouth is pink and moist. Dentition fair Heart: regular, rate, and rhythm.  Normal s1,s2. No obvious murmurs, gallops, or rubs noted. Lungs: CTAB, no wheezes, rhonchi, or rales noted.  Respiratory effort nonlabored Abd: soft, mild ttp in epigastric abdomen without peritonitis,  negative murphy sign, ND, +BS, no masses, hernias, or organomegaly MS: no BUE/BLE edema, calves soft and nontender Skin: warm and dry with no masses, lesions, or rashes Psych: A&Ox4 with an appropriate affect Neuro: MAEs, no gross motor or sensory deficits BUE/BLE  Results for orders placed or performed during the hospital encounter of 11/22/22 (from the past 48 hour(s))  CBC with Differential     Status: Abnormal   Collection Time: 11/22/22  8:25 AM  Result Value Ref Range   WBC 14.4 (H) 4.0 - 10.5 K/uL   RBC 3.93 3.87 - 5.11 MIL/uL   Hemoglobin 10.8 (L) 12.0 - 15.0 g/dL   HCT 33.3 (L) 36.0 - 46.0 %   MCV 84.7 80.0 - 100.0 fL   MCH 27.5 26.0 - 34.0 pg   MCHC 32.4 30.0 - 36.0 g/dL   RDW 16.1 (H) 11.5 - 15.5 %   Platelets 312 150 - 400 K/uL   nRBC 0.0 0.0 - 0.2 %   Neutrophils Relative % 72 %   Neutro Abs 10.4 (H) 1.7 - 7.7 K/uL   Lymphocytes Relative 19 %   Lymphs Abs 2.8 0.7 - 4.0 K/uL   Monocytes Relative 8 %   Monocytes Absolute 1.1 (H) 0.1 - 1.0 K/uL   Eosinophils Relative 1 %   Eosinophils Absolute 0.1 0.0 - 0.5 K/uL   Basophils Relative 0 %   Basophils Absolute 0.1 0.0 - 0.1 K/uL   Immature Granulocytes 0 %   Abs Immature Granulocytes 0.06 0.00 - 0.07 K/uL    Comment: Performed at [facility], [place], [place] [address]  Comprehensive metabolic panel     Status: Abnormal   Collection Time: 11/22/22  8:25 AM  Result Value Ref Range   Sodium 141 135 - 145 mmol/L   Potassium 3.4 (L) 3.5 - 5.1 mmol/L   Chloride 106 98 - 111 mmol/L   CO2 24 22 - 32 mmol/L   Glucose, Bld 129 (H) 70 - 99 mg/dL    Comment: Glucose reference range applies only to samples taken after fasting for at least 8 hours.   BUN 16 6 - 20 mg/dL   Creatinine, Ser 0.90 0.44 - 1.00 mg/dL   Calcium 9.0 8.9 - 10.3 mg/dL   Total Protein 7.2 6.5 - 8.1 g/dL   Albumin 4.4 3.5 - 5.0 g/dL   AST 10 (L) 15 - 41 U/L   ALT 11 0 - 44 U/L   Alkaline Phosphatase 37 (L) 38 - 126  U/L   Total Bilirubin 0.3 0.3 - 1.2 mg/dL   GFR, Estimated >60 >60 mL/min    Comment: (NOTE) Calculated using the CKD-EPI Creatinine Equation (2021)    Anion gap 11 5 - 15    Comment: Performed at [facility], [place], [place] [address]  Lipase, blood     Status: Abnormal   Collection Time: 11/22/22  8:25 AM  Result Value Ref Range   Lipase <10 (L) 11 - 51 U/L    Comment: Performed at [facility], [address] [place], [place] [address]  Lactic acid, plasma     Status: Abnormal   Collection Time: 11/22/22  8:25 AM  Result Value Ref Range   Lactic Acid, Venous 2.4 (HH) 0.5 - 1.9 mmol/L    Comment: CRITICAL RESULT CALLED TO, READ BACK BY AND VERIFIED WITH: Corliss Parish 2585 11/22/2022 DBRADLEY Performed at [facility], [place], [place] [address]   Lactic acid, plasma     Status: None   Collection Time: 11/22/22  8:25 AM  Result Value Ref Range   Lactic Acid, Venous 1.9 0.5 - 1.9 mmol/L    Comment: Performed at [facility], [place], [place] [address]  Urinalysis, Routine w reflex microscopic -     Status: Abnormal   Collection Time: 11/22/22  8:25 AM  Result Value Ref Range   Color, Urine YELLOW YELLOW   APPearance CLEAR CLEAR   Specific Gravity, Urine 1.017 1.005 - 1.030   pH 6.0 5.0 - 8.0   Glucose, UA NEGATIVE NEGATIVE mg/dL   Hgb urine dipstick NEGATIVE NEGATIVE   Bilirubin Urine NEGATIVE NEGATIVE   Ketones, ur NEGATIVE NEGATIVE mg/dL   Protein, ur NEGATIVE NEGATIVE mg/dL   Nitrite NEGATIVE NEGATIVE   Leukocytes,Ua MODERATE (A) NEGATIVE   RBC / HPF 0-5 0 - 5 RBC/hpf   WBC, UA 6-10 0 - 5 WBC/hpf   Bacteria, UA FEW (A) NONE SEEN   Squamous Epithelial / HPF 11-20 0 - 5 /HPF   Mucus PRESENT     Comment: Performed at [facility], [address] [place], [place] [address]   US Abdomen Limited RUQ (LIVER/GB)  Result Date:  11/22/2022 CLINICAL DATA:  [id number] Epigastric abdominal pain 114841 EXAM: ULTRASOUND ABDOMEN LIMITED COMPARISON:  None Available. FINDINGS: The liver demonstrates normal parenchymal echogenicity and homogeneous texture without focal hepatic parenchymal lesions or intrahepatic ductal dilatation. Gallbladder wall is thickened and edematous. No pericholecystic fluid or shadowing stones identified. CBD measured 0.2cm. IMPRESSION: Abnormal gallbladder with an edematous thickened wall. If indicated, to evaluate for cholecystitis, consider HIDA scan. Otherwise unremarkable right upper quadrant. Electronically Signed   By: Sammie Bench M.D.   On: 11/22/2022 09:26   CT ABDOMEN PELVIS W CONTRAST  Result Date: 11/20/2022 CLINICAL DATA:  Abdominal pain, acute, nonlocalized EXAM: CT ABDOMEN AND PELVIS WITH CONTRAST TECHNIQUE: Multidetector CT imaging of the abdomen and pelvis was performed using the standard protocol following bolus administration of intravenous contrast. RADIATION DOSE REDUCTION: This exam was performed according to the departmental dose-optimization program which includes automated exposure control, adjustment of the mA and/or kV according to patient size and/or use of iterative reconstruction technique. CONTRAST:  30mL OMNIPAQUE IOHEXOL 300 MG/ML  SOLN COMPARISON:  None Available. FINDINGS: Lower chest: No acute abnormality. Hepatobiliary: No focal liver abnormality is seen. The gallbladder is unremarkable. Pancreas: Unremarkable. No pancreatic ductal dilatation or surrounding inflammatory changes. Spleen: Normal in size without focal abnormality. Adrenals/Urinary Tract: Adrenal glands are unremarkable. No hydronephrosis or nephrolithiasis. Bladder is unremarkable. Stomach/Bowel: The stomach is within normal limits. There is no evidence of bowel obstruction.The appendix is normal. Vascular/Lymphatic: No significant vascular findings are present. No enlarged abdominal or pelvic lymph nodes.  Reproductive: Unremarkable for age. Other: Trace free fluid in the pelvis, nonspecific but likely physiologic. Musculoskeletal: No acute or significant osseous findings. IMPRESSION: No acute findings in the abdomen or pelvis.  Normal appendix. Electronically Signed   By: Maurine Simmering M.D.   On: 11/20/2022 16:17  Assessment/Plan Epigastric abdominal pain, nausea, vomiting - Patient with multiple episodes of epigastric abdominal pain, nausea, and vomiting - U/s does show an edematous and thick walled gallbladder, but no gallstones.  - Agree with HIDA for further evaluation of possible acalculous cholecystitis. If HIDA is negative may need GI consult for consideration of EGD  ID - cipro/flagyl VTE - ok to have SQH or LMWH from a surgical standpoint FEN - NPO for HIDA, IVF Foley - None   Recommend medical admission and HIDA scan. If HIDA negative will need GI consult. If HIDA positive then will discuss laparoscopic cholecystectomy.   I reviewed ED provider notes, last 24 h vitals and pain scores, last 48 h intake and output, last 24 h labs and trends, and last 24 h imaging results.   Juliet Rude, [facility] Surgery 11/22/2022, 3:06 PM Please see Amion for pager number during day hours 7:00am-4:30pm

## 2022-11-22 NOTE — ED Provider Notes (Signed)
  Provider Note MRN:  [id number]  Arrival date & time: 11/22/22    ED Course and Medical Decision Making  Assumed care from [facility] at shift change.  See note from prior team for complete details, in brief:  32 yo female Sent from [facility] for chole Pending HIDA for surg admit  Plan per prior physician f/u surgery recommendations  Pt with RUQ abd pain, imaging concerning ?cholecystitis, pending HIDA per gen surg recommendation. See consult note for more details  Per gen surg - if HIDA positive then OR, if negative then further eval w/ GI  Admit to medical service.   No opiates after MN for HIDA tomorrow  Spoke w/ Dr Jonelle Sidle who accepts pt for admit   Procedures  Final Clinical Impressions(s) / ED Diagnoses     ICD-10-CM   1. RUQ abdominal pain  R10.11     2. Abnormal findings on diagnostic imaging of gall bladder  R93.2     3. Nausea and vomiting, unspecified vomiting type  R11.2       ED Discharge Orders     None       Discharge Instructions   None        Jeanell Sparrow, DO 11/22/22 1959

## 2022-11-22 NOTE — ED Provider Notes (Addendum)
Patient comfortable on arrival from [place].  Surgery PA (B. Meuth) at bedside.  Patient requires HIDA scan and surgical reevaluation.   1500 - Nuclear Medicine department made aware of patient's arrival here at [facility] and need to perform HIDA scan today.  Surgery is requesting HIDA scan be performed.  If Scan is positive - and concerning for cholecystitis - then surgery will admit.  Otherwise, patient may require medical admission.    Valarie Merino, MD 11/22/22 1433    Valarie Merino, MD 11/22/22 [phone]

## 2022-11-22 NOTE — ED Notes (Signed)
Patient reports she would like to leave AMA.

## 2022-11-23 ENCOUNTER — Encounter: Payer: Self-pay | Admitting: Gastroenterology

## 2022-11-23 ENCOUNTER — Other Ambulatory Visit (HOSPITAL_COMMUNITY): Payer: Self-pay

## 2022-11-23 ENCOUNTER — Inpatient Hospital Stay (HOSPITAL_COMMUNITY): Payer: Medicaid Other

## 2022-11-23 DIAGNOSIS — R109 Unspecified abdominal pain: Secondary | ICD-10-CM | POA: Diagnosis not present

## 2022-11-23 DIAGNOSIS — R112 Nausea with vomiting, unspecified: Secondary | ICD-10-CM | POA: Diagnosis not present

## 2022-11-23 DIAGNOSIS — R1011 Right upper quadrant pain: Secondary | ICD-10-CM

## 2022-11-23 DIAGNOSIS — R1013 Epigastric pain: Secondary | ICD-10-CM

## 2022-11-23 DIAGNOSIS — R932 Abnormal findings on diagnostic imaging of liver and biliary tract: Secondary | ICD-10-CM | POA: Diagnosis not present

## 2022-11-23 DIAGNOSIS — K810 Acute cholecystitis: Secondary | ICD-10-CM | POA: Diagnosis not present

## 2022-11-23 LAB — URINE CULTURE: Culture: <10000 — AB

## 2022-11-23 LAB — CBC
HCT: 27.1 % — ABNORMAL LOW (ref 36.0–46.0)
Hemoglobin: 8.8 g/dL — ABNORMAL LOW (ref 12.0–15.0)
MCH: 27.7 pg (ref 26.0–34.0)
MCHC: 32.5 g/dL (ref 30.0–36.0)
MCV: 85.2 fL (ref 80.0–100.0)
Platelets: 192 10*3/uL (ref 150–400)
RBC: 3.18 MIL/uL — ABNORMAL LOW (ref 3.87–5.11)
RDW: 16.2 % — ABNORMAL HIGH (ref 11.5–15.5)
WBC: 5.7 10*3/uL (ref 4.0–10.5)
nRBC: 0 % (ref 0.0–0.2)

## 2022-11-23 LAB — COMPREHENSIVE METABOLIC PANEL
ALT: 17 U/L (ref 0–44)
AST: 19 U/L (ref 15–41)
Albumin: 3.3 g/dL — ABNORMAL LOW (ref 3.5–5.0)
Alkaline Phosphatase: 32 U/L — ABNORMAL LOW (ref 38–126)
Anion gap: 6 (ref 5–15)
BUN: 8 mg/dL (ref 6–20)
CO2: 25 mmol/L (ref 22–32)
Calcium: 8.5 mg/dL — ABNORMAL LOW (ref 8.9–10.3)
Chloride: 108 mmol/L (ref 98–111)
Creatinine, Ser: 0.9 mg/dL (ref 0.44–1.00)
GFR, Estimated: >60 mL/min (ref >60)
Glucose, Bld: 91 mg/dL (ref 70–99)
Potassium: 3.1 mmol/L — ABNORMAL LOW (ref 3.5–5.1)
Sodium: 139 mmol/L (ref 135–145)
Total Bilirubin: 0.5 mg/dL (ref 0.3–1.2)
Total Protein: 5.6 g/dL — ABNORMAL LOW (ref 6.5–8.1)

## 2022-11-23 LAB — HIV ANTIBODY (ROUTINE TESTING W REFLEX): HIV Screen 4th Generation wRfx: NONREACTIVE

## 2022-11-23 MED ORDER — TECHNETIUM TC 99M MEBROFENIN IV KIT
5.1000 | PACK | Freq: Once | INTRAVENOUS | Status: AC | PRN
  Administered 2022-11-23: 5.1 via INTRAVENOUS

## 2022-11-23 MED ORDER — PANTOPRAZOLE SODIUM 40 MG PO TBEC
40.0000 mg | DELAYED_RELEASE_TABLET | Freq: Every day | ORAL | 0 refills | Status: DC
  Filled 2022-11-23: qty 30, 30d supply, fill #0

## 2022-11-23 MED ORDER — PANTOPRAZOLE SODIUM 40 MG PO TBEC: 40.0000 mg | DELAYED_RELEASE_TABLET | Freq: Every day | ORAL | Status: DC

## 2022-11-23 NOTE — Discharge Summary (Signed)
Physician Discharge Summary   Patient: Denise [name] MRN: [id number] DOB: Jan 20, 1991  Admit date:     11/22/2022  Discharge date: 11/23/22  Discharge Physician: Marylu Lund   PCP: Maximiano Coss, NP   Recommendations at discharge:    Follow up with PCP in 1-2 weeks  Discharge Diagnoses: Principal Problem:   Acute cholecystitis Active Problems:   ADHD (attention deficit hyperactivity disorder)   GAD (generalized anxiety disorder)   Family history of MS (multiple sclerosis)   Hypokalemia  Resolved Problems:   * No resolved hospital problems. *  Hospital Course: 32 y.o. female with medical history significant of ADHD, anxiety disorder, genital herpes, who presents from home with abdominal pain nausea vomiting.  Patient has had recurrent symptoms apparently in the past.  She was seen in the ER event 2 days ago.  She has had 6 visits where she was told she has gallbladder problems.  She came back today as the symptoms were getting worse.  Patient was found to have lactic acidosis.  Leukocytosis with a white count of 14.  Potassium 3.4.  CT abdomen pelvis showed no acute findings abdominal ultrasound showed abnormal gallbladder with thickened wall.  Suspected acute cholecystitis.  Surgery consulted and recommend HIDA scan and admission to the hospital for that.   Assessment and Plan: #1 suspected acute cholecystitis:  -possibly thickened gallbladder on [place] -Gen surg consulted. HIDA scan performed 1/25, found to be neg. Surgery signed off and recommended GI involvement -Consulted GI. Pt reportedly was not interested in pursuing endoscopic eval during hospitalization. -GI recs to avoid NSAIDs and for outpatient GI follow up, consider eventual EGD if pt willing and gastric emptying study as outpatient -GI recs for protonix 40mg  daily on d/c -discussed with GI, ok to d/c today with close outpatient f/u   #2 hypokalemia: Replaced.   #3 leukocytosis: Most likely due to inflammation.   Also dehydration.  Normalized with IVF   #4 history of ADHD: Defer home treatment for now  #5 Anemia: hgb down to 8.8, however all blood lines are lower and in setting of aggressive IVF hydration at 125cc/hr and without evidence of blood loss. Suspecting dilutional       Consultants: General Surgery, GI Procedures performed:   Disposition: Home Diet recommendation:  Regular diet DISCHARGE MEDICATION: Allergies as of 11/23/2022       Reactions   Cephalosporins Anaphylaxis, Hives        Medication List     STOP taking these medications    sulfamethoxazole-trimethoprim 800-160 MG tablet Commonly known as: BACTRIM DS       TAKE these medications    FLUoxetine 20 MG capsule Commonly known as: PROZAC Take 1 capsule (20 mg total) by mouth daily.   norgestimate-ethinyl estradiol 0.25-35 MG-MCG tablet Commonly known as: ORTHO-CYCLEN Take 1 tablet by mouth daily.   ondansetron 4 MG disintegrating tablet Commonly known as: ZOFRAN-ODT Take 1 tablet (4 mg total) by mouth every 8 (eight) hours as needed for nausea or vomiting.   pantoprazole 40 MG tablet Commonly known as: Protonix Take 1 tablet (40 mg total) by mouth daily.   Ubrelvy 100 MG Tabs Generic drug: Ubrogepant Take 100 mg by mouth every 2 (two) hours as needed. Maximum 200mg  a day. What changed: Another medication with the same name was removed. Continue taking this medication, and follow the directions you see here.   valACYclovir 500 MG tablet Commonly known as: VALTREX Take 1 tablet (500 mg total) by mouth 2 (two) times  daily.        Follow-up Information     Janeece Agee, NP Follow up in 2 week(s).   Specialty: Nurse Practitioner Why: Hospital follow up Contact information: [address] [place] [address] [phone]         [facility] Follow up.   Specialty: Gastroenterology Why: as scheduled, Hospital follow up Contact information: [address] [place] [address] [phone]               Discharge Exam: Ceasar Mons Weights   11/22/22 5400 11/22/22 2124  Weight: 49.9 kg 48.2 kg   General exam: Awake, laying in bed, in nad Respiratory system: Normal respiratory effort, no wheezing Cardiovascular system: regular rate, s1, s2 Gastrointestinal system: Soft, nondistended, positive BS Central nervous system: CN2-12 grossly intact, strength intact Extremities: Perfused, no clubbing Skin: Normal skin turgor, no notable skin lesions seen Psychiatry: Mood normal // no visual hallucinations   Condition at discharge: fair  The results of significant diagnostics from this hospitalization (including imaging, microbiology, ancillary and laboratory) are listed below for reference.   Imaging Studies: NM Hepatobiliary Liver Func  Result Date: 11/23/2022 CLINICAL DATA:  Abdominal pain.  Abnormal ultrasound examination. EXAM: NUCLEAR MEDICINE HEPATOBILIARY IMAGING TECHNIQUE: Sequential images of the abdomen were obtained out to 60 minutes following intravenous administration of radiopharmaceutical. RADIOPHARMACEUTICALS:  5.1 mCi Tc-57m  Choletec IV COMPARISON:  CT scan 11/20/2022 and ultrasound 11/22/2022 FINDINGS: Prompt uptake and biliary excretion of activity by the liver is seen. Gallbladder activity is visualized, consistent with patency of cystic duct. Biliary activity passes into small bowel, consistent with patent common bile duct. IMPRESSION: Normal biliary patency study. Electronically Signed   By: Rudie Meyer M.D.   On: 11/23/2022 11:51   US Abdomen Limited RUQ (LIVER/GB)  Result Date: 11/22/2022 CLINICAL DATA:  [id number] Epigastric abdominal pain 114841 EXAM: ULTRASOUND ABDOMEN LIMITED COMPARISON:  None Available. FINDINGS: The liver demonstrates normal parenchymal echogenicity and homogeneous texture without focal hepatic parenchymal lesions or intrahepatic ductal dilatation. Gallbladder wall is thickened  and edematous. No pericholecystic fluid or shadowing stones identified. CBD measured 0.2cm. IMPRESSION: Abnormal gallbladder with an edematous thickened wall. If indicated, to evaluate for cholecystitis, consider HIDA scan. Otherwise unremarkable right upper quadrant. Electronically Signed   By: Layla Maw M.D.   On: 11/22/2022 09:26   CT ABDOMEN PELVIS W CONTRAST  Result Date: 11/20/2022 CLINICAL DATA:  Abdominal pain, acute, nonlocalized EXAM: CT ABDOMEN AND PELVIS WITH CONTRAST TECHNIQUE: Multidetector CT imaging of the abdomen and pelvis was performed using the standard protocol following bolus administration of intravenous contrast. RADIATION DOSE REDUCTION: This exam was performed according to the departmental dose-optimization program which includes automated exposure control, adjustment of the mA and/or kV according to patient size and/or use of iterative reconstruction technique. CONTRAST:  40mL OMNIPAQUE IOHEXOL 300 MG/ML  SOLN COMPARISON:  None Available. FINDINGS: Lower chest: No acute abnormality. Hepatobiliary: No focal liver abnormality is seen. The gallbladder is unremarkable. Pancreas: Unremarkable. No pancreatic ductal dilatation or surrounding inflammatory changes. Spleen: Normal in size without focal abnormality. Adrenals/Urinary Tract: Adrenal glands are unremarkable. No hydronephrosis or nephrolithiasis. Bladder is unremarkable. Stomach/Bowel: The stomach is within normal limits. There is no evidence of bowel obstruction.The appendix is normal. Vascular/Lymphatic: No significant vascular findings are present. No enlarged abdominal or pelvic lymph nodes. Reproductive: Unremarkable for age. Other: Trace free fluid in the pelvis, nonspecific but likely physiologic. Musculoskeletal: No acute or significant osseous findings. IMPRESSION: No acute  findings in the abdomen or pelvis.  Normal appendix. Electronically Signed   By: Maurine Simmering M.D.   On: 11/20/2022 16:17     Microbiology: Results for orders placed or performed during the hospital encounter of 11/22/22  Urine Culture     Status: Abnormal   Collection Time: 11/22/22  8:25 AM   Specimen: Urine, Clean Catch  Result Value Ref Range Status   Specimen Description   Final    URINE, CLEAN CATCH Performed at [facility], [address] [place], [place] [address]    Special Requests   Final    NONE Performed at [facility], [address] [place], [place] [address]    Culture (A)  Final    <10,000 COLONIES/mL INSIGNIFICANT GROWTH Performed at [facility] [address] [place], [place] [address]    Report Status 11/23/2022 FINAL  Final    Labs: CBC: Recent Labs  Lab 11/20/22 1308 11/22/22 0825 11/23/22 0645  WBC 12.6* 14.4* 5.7  NEUTROABS  --  10.4*  --   HGB 11.4* 10.8* 8.8*  HCT 34.9* 33.3* 27.1*  MCV 84.3 84.7 85.2  PLT 353 312 680   Basic Metabolic Panel: Recent Labs  Lab 11/20/22 1308 11/22/22 0825 11/23/22 0645  NA 140 141 139  K 4.1 3.4* 3.1*  CL 110 106 108  CO2 20* 24 25  GLUCOSE 165* 129* 91  BUN 16 16 8   CREATININE 0.79 0.90 0.90  CALCIUM 9.8 9.0 8.5*   Liver Function Tests: Recent Labs  Lab 11/20/22 1308 11/22/22 0825 11/23/22 0645  AST 18 10* 19  ALT 14 11 17   ALKPHOS 44 37* 32*  BILITOT 0.5 0.3 0.5  PROT 8.0 7.2 5.6*  ALBUMIN 5.0 4.4 3.3*   CBG: Recent Labs  Lab 11/20/22 1306  GLUCAP 156*    Discharge time spent: less than 30 minutes.  Signed: Marylu Lund, MD [facility] 11/23/2022

## 2022-11-23 NOTE — TOC Initial Note (Signed)
Transition of Care [facility]) - Initial/Assessment Note    Patient Details  Name: Denise [name] MRN: [id number] Date of Birth: [birth year]  Transition of Care [facility]) CM/SW Contact:    Verdell Carmine, RN Phone Number: 11/23/2022, 12:22 PM  Clinical Narrative:                  Transitions of Care (TOC) has reviewed the patient and found no needs at this time. The patient will be discussed on daily progression rounds. If a need  is identified, please place a TOC consult       Patient Goals and CMS Choice            Expected Discharge Plan and Services                                              Prior Living Arrangements/Services                       Activities of Daily Living Home Assistive Devices/Equipment: None ADL Screening (condition at time of admission) Patient's cognitive ability adequate to safely complete daily activities?: Yes Is the patient deaf or have difficulty hearing?: No Does the patient have difficulty seeing, even when wearing glasses/contacts?: No Does the patient have difficulty concentrating, remembering, or making decisions?: No Patient able to express need for assistance with ADLs?: Yes Does the patient have difficulty dressing or bathing?: No Independently performs ADLs?: Yes (appropriate for developmental age) Does the patient have difficulty walking or climbing stairs?: No Weakness of Legs: None Weakness of Arms/Hands: None  Permission Sought/Granted                  Emotional Assessment              Admission diagnosis:  Acute cholecystitis [K81.0] RUQ abdominal pain [R10.11] Abnormal findings on diagnostic imaging of gall bladder [R93.2] Nausea and vomiting, unspecified vomiting type [R11.2] Patient Active Problem List   Diagnosis Date Noted   Acute cholecystitis 11/22/2022   Hypokalemia 11/22/2022   Acute cystitis without hematuria 04/24/2022   Family history of MS (multiple sclerosis) 04/24/2022    Leg heaviness 04/24/2022   GAD (generalized anxiety disorder) 03/13/2022   MDD (major depressive disorder) 03/13/2022   Postpartum depression 08/12/2019   Migraine without aura and without status migrainosus, not intractable 09/10/2018   PROM (premature rupture of membranes) 04/19/2017   Spontaneous vaginal delivery 04/19/2017   Normal labor 04/17/2017   GBS (group B Streptococcus carrier), +RV culture, currently pregnant 03/29/2017   History of herpes genitalis 03/29/2017   Rh negative, antepartum 01/16/2017   Supervision of normal pregnancy, antepartum 11/21/2016   Insomnia 04/19/2015   ADHD (attention deficit hyperactivity disorder) 01/04/2015   Pityriasis rosea 01/04/2015   PCP:  Maximiano Coss, NP Pharmacy:   [place], [place] [address] Phone: [phone] Fax: [phone]     Social Determinants of Health (SDOH) Social History: SDOH Screenings   Food Insecurity: No Food Insecurity (11/22/2022)  Housing: Low Risk  (11/22/2022)  Transportation Needs: No Transportation Needs (11/22/2022)  Utilities: Not At Risk (11/22/2022)  Depression (PHQ2-9): Low Risk  (04/24/2022)  Recent Concern: Depression (PHQ2-9) - Medium Risk (03/13/2022)  Tobacco Use: Low Risk  (11/22/2022)   SDOH Interventions:  Readmission Risk Interventions     No data to display

## 2022-11-23 NOTE — Hospital Course (Signed)
32 y.o. female with medical history significant of ADHD, anxiety disorder, genital herpes, who presents from home with abdominal pain nausea vomiting.  Patient has had recurrent symptoms apparently in the past.  She was seen in the ER event 2 days ago.  She has had 6 visits where she was told she has gallbladder problems.  She came back today as the symptoms were getting worse.  Patient was found to have lactic acidosis.  Leukocytosis with a white count of 14.  Potassium 3.4.  CT abdomen pelvis showed no acute findings abdominal ultrasound showed abnormal gallbladder with thickened wall.  Suspected acute cholecystitis.  Surgery consulted and recommend HIDA scan and admission to the hospital for that.

## 2022-11-23 NOTE — Progress Notes (Signed)
Progress Note     Subjective: Pt reports abdominal pain and n/v are resolved. Wants to go home. Discussed HIDA results and she is very grateful that no surgery recommended at this time.   Objective: Vital signs in last 24 hours: Temp:  [98.1 F (36.7 C)-99.6 F (37.6 C)] 98.6 F (37 C) (01/25 0600) Pulse Rate:  [66-81] 81 (01/25 0600) Resp:  [16-18] 18 (01/24 2124) BP: (108-132)/(61-78) 122/78 (01/25 0600) SpO2:  [98 %-100 %] 98 % (01/25 0600) Weight:  [48.2 kg] 48.2 kg (01/24 2124) Last BM Date : 11/22/22  Intake/Output from previous day: 01/24 0701 - 01/25 0700 In: 2150.3 [IV Piggyback:2150.3] Out: -  Intake/Output this shift: No intake/output data recorded.  PE: General: pleasant, WD/WN female who is laying in bed in NAD HEENT: Sclera are anicteric Lungs:  Respiratory effort nonlabored Abd: soft, non tender, ND    Lab Results:  Recent Labs    11/22/22 0825 11/23/22 0645  WBC 14.4* 5.7  HGB 10.8* 8.8*  HCT 33.3* 27.1*  PLT 312 192   BMET Recent Labs    11/22/22 0825 11/23/22 0645  NA 141 139  K 3.4* 3.1*  CL 106 108  CO2 24 25  GLUCOSE 129* 91  BUN 16 8  CREATININE 0.90 0.90  CALCIUM 9.0 8.5*   PT/INR No results for input(s): "LABPROT", "INR" in the last 72 hours. CMP     Component Value Date/Time   NA 139 11/23/2022 0645   NA 142 09/09/2018 0844   K 3.1 (L) 11/23/2022 0645   CL 108 11/23/2022 0645   CO2 25 11/23/2022 0645   GLUCOSE 91 11/23/2022 0645   BUN 8 11/23/2022 0645   BUN 14 09/09/2018 0844   CREATININE 0.90 11/23/2022 0645   CALCIUM 8.5 (L) 11/23/2022 0645   PROT 5.6 (L) 11/23/2022 0645   PROT 6.9 09/09/2018 0844   ALBUMIN 3.3 (L) 11/23/2022 0645   ALBUMIN 4.3 09/09/2018 0844   AST 19 11/23/2022 0645   ALT 17 11/23/2022 0645   ALKPHOS 32 (L) 11/23/2022 0645   BILITOT 0.5 11/23/2022 0645   BILITOT <0.2 09/09/2018 0844   GFRNONAA >60 11/23/2022 0645   GFRAA 113 09/09/2018 0844   Lipase     Component Value Date/Time    LIPASE <10 (L) 11/22/2022 0825       Studies/Results: NM Hepatobiliary Liver Func  Result Date: 11/23/2022 CLINICAL DATA:  Abdominal pain.  Abnormal ultrasound examination. EXAM: NUCLEAR MEDICINE HEPATOBILIARY IMAGING TECHNIQUE: Sequential images of the abdomen were obtained out to 60 minutes following intravenous administration of radiopharmaceutical. RADIOPHARMACEUTICALS:  5.1 mCi Tc-36m  Choletec IV COMPARISON:  CT scan 11/20/2022 and ultrasound 11/22/2022 FINDINGS: Prompt uptake and biliary excretion of activity by the liver is seen. Gallbladder activity is visualized, consistent with patency of cystic duct. Biliary activity passes into small bowel, consistent with patent common bile duct. IMPRESSION: Normal biliary patency study. Electronically Signed   By: Marijo Sanes M.D.   On: 11/23/2022 11:51   US Abdomen Limited RUQ (LIVER/GB)  Result Date: 11/22/2022 CLINICAL DATA:  [id number] Epigastric abdominal pain 114841 EXAM: ULTRASOUND ABDOMEN LIMITED COMPARISON:  None Available. FINDINGS: The liver demonstrates normal parenchymal echogenicity and homogeneous texture without focal hepatic parenchymal lesions or intrahepatic ductal dilatation. Gallbladder wall is thickened and edematous. No pericholecystic fluid or shadowing stones identified. CBD measured 0.2cm. IMPRESSION: Abnormal gallbladder with an edematous thickened wall. If indicated, to evaluate for cholecystitis, consider HIDA scan. Otherwise unremarkable right upper quadrant. Electronically Signed  By: Sammie Bench M.D.   On: 11/22/2022 09:26    Anti-infectives: Anti-infectives (From admission, onward)    Start     Dose/Rate Route Frequency Ordered Stop   11/22/22 1515  ciprofloxacin (CIPRO) IVPB 400 mg        400 mg 200 mL/hr over 60 Minutes Intravenous Every 12 hours 11/22/22 1504     11/22/22 1515  metroNIDAZOLE (FLAGYL) IVPB 500 mg        500 mg 100 mL/hr over 60 Minutes Intravenous Every 12 hours 11/22/22 1504           Assessment/Plan  Epigastric abdominal pain, nausea, vomiting - Patient with multiple episodes of epigastric abdominal pain, nausea, and vomiting - U/s does show an edematous and thick walled gallbladder, but no gallstones.  - HIDA done this AM and negative for acute cholecystitis, gallbladder wall thickening on [place] likely reactive from some other process  - recommend GI consult for further workup, general surgery will sign off at this time   ID - cipro/flagyl VTE - ok to have SQH or LMWH from a surgical standpoint FEN - NPO, IVF Foley - None    LOS: 1 day   I reviewed hospitalist notes, last 24 h vitals and pain scores, last 48 h intake and output, last 24 h labs and trends, and last 24 h imaging results.    Norm Parcel, [facility] 11/23/2022, 11:59 AM Please see Amion for pager number during day hours 7:00am-4:30pm

## 2022-11-23 NOTE — Consult Note (Addendum)
Referring Provider: Dr. Marylu Lund Primary Care Physician:  Maximiano Coss, NP Primary Gastroenterologist:    Reason for Consultation:  Epigastric pain, N/V  HPI: Denise [name] is a 32 y.o. female with a past medical history of anxiety and attention deficit hyperactivity disorder.  She awakened with intractable nausea and vomiting at 5:30am on 11/20/2022 which progressively worsened therefore she presented to the ED for further evaluation. Labs in the ED showed a WBC count of 12.6.  Hemoglobin 11.4.  Hematocrit 34.9.  Platelet 353.  Glucose 165.  BUN 16.  Creatinine 0.79.  Normal LFTs. Lipase < 10.  SARS coronavirus 2 negative.  Urinalysis with leukocytes, culture pending.  Urine pregnancy test negative.  HIV nonreactive.  CTAP with contrast without acute intra-abdominal/pelvic pathology to explain her symptoms.  She was discharged home on Zofran. She had recurrence of N/V on 11/22/2022 and she went back to the ED. Labs in the ED showed a WBC count of 14.4.  Hemoglobin 10.8.  Platelet 312.  BUN 16.  Creatinine 0.90.  Potassium 3.4.  Alk phos 37.  Total bili 0.3.  AST 10.  ALT 11.Lipase < 10. RUQ sonogram identified an abnormal gallbladder with edematous thickened wall otherwise was unremarkable.  She underwent a HIDA scan today which was normal.  A GI consult was requested for further evaluation regarding nausea and vomiting. She stated she does not have upper abdominal pain. She describes having a "nausea ball" to the epigastrium. She awakened 1/22 and 1/24 with nausea followed by numerous 5 episodes of vomiting followed by excessive dry heaves.  Emesis described as partially digested food and bilious to clear secretions.  No coffee-ground emesis, black or frank red hematemesis.  No heartburn or dysphagia.  She passes a normal brown BM daily.  No rectal bleeding or black stools.  She passed a brown BM earlier today.  She takes ibuprofen 400 mg twice weekly for headaches.  She rarely takes Goody  powder for aches and pains.  She previously smoked marijuana several days weekly which she discontinued 3 months ago.  She reported going to the ED at least 4 times over the past year with similar nausea and vomiting symptoms.  No specific food or stress triggers.  She eats a vegetarian diet.  She denies ever having an EGD.  She does not wish to pursue an EGD during this hospitalization.  She stated Zofran was ineffective and her nausea is typically better controlled on Phenergan.  Her fianc is at the bedside.  Past Medical History:  Diagnosis Date   ADHD (attention deficit hyperactivity disorder)    Anxiety    Genital herpes     Past Surgical History:  Procedure Laterality Date   NO PAST SURGERIES      Prior to Admission medications   Medication Sig Start Date End Date Taking? Authorizing Provider  FLUoxetine (PROZAC) 20 MG capsule Take 1 capsule (20 mg total) by mouth daily. 04/24/22  Yes Maximiano Coss, NP  norgestimate-ethinyl estradiol (ORTHO-CYCLEN) 0.25-35 MG-MCG tablet Take 1 tablet by mouth daily. 02/21/22  Yes [provider]  ondansetron (ZOFRAN-ODT) 4 MG disintegrating tablet Take 1 tablet (4 mg total) by mouth every 8 (eight) hours as needed for nausea or vomiting. 11/20/22  Yes Rancour, Annie Main, MD  Ubrogepant (UBRELVY) 100 MG TABS Take 100 mg by mouth every 2 (two) hours as needed. Maximum 200mg  a day. 05/31/22  Yes Melvenia Beam, MD  valACYclovir (VALTREX) 500 MG tablet Take 1 tablet (500 mg total)  by mouth 2 (two) times daily. 03/13/22  Yes Maximiano Coss, NP  sulfamethoxazole-trimethoprim (BACTRIM DS) 800-160 MG tablet Take 1 tablet by mouth 2 (two) times daily. Patient not taking: Reported on 11/22/2022 04/24/22   Maximiano Coss, NP  Ubrogepant (UBRELVY) 100 MG TABS Take 100 mg by mouth every 2 (two) hours as needed. Maximum 200mg  a day. 06/04/22   Melvenia Beam, MD    Current Facility-Administered Medications  Medication Dose Route Frequency Provider Last Rate  Last Admin   ciprofloxacin (CIPRO) IVPB 400 mg  400 mg Intravenous Q12H Norm Parcel, PA-C 200 mL/hr at 11/23/22 0415 400 mg at 11/23/22 0415   dextrose 5 % in lactated ringers infusion   Intravenous Continuous Donne Hazel, MD 50 mL/hr at 11/23/22 1049 Rate Change at 11/23/22 1049   HYDROmorphone (DILAUDID) injection 1 mg  1 mg Intravenous Q3H PRN Elwyn Reach, MD   1 mg at 11/22/22 2227   metroNIDAZOLE (FLAGYL) IVPB 500 mg  500 mg Intravenous Q12H Norm Parcel, PA-C 100 mL/hr at 11/23/22 5784 500 mg at 11/23/22 0312   ondansetron (ZOFRAN) tablet 4 mg  4 mg Oral Q6H PRN Elwyn Reach, MD       Or   ondansetron (ZOFRAN) injection 4 mg  4 mg Intravenous Q6H PRN Elwyn Reach, MD   4 mg at 11/22/22 2227   Oral care mouth rinse  15 mL Mouth Rinse PRN Elwyn Reach, MD        Allergies as of 11/22/2022 - Review Complete 11/22/2022  Allergen Reaction Noted   Cephalosporins Anaphylaxis and Hives 10/13/2014    Family History  Problem Relation Age of Onset   Multiple sclerosis Mother    Breast cancer Mother 46   Skin cancer Father    Lung cancer Maternal Grandfather        smoker   Stroke Paternal Grandmother    Heart attack Paternal Grandfather    Multiple sclerosis Maternal Aunt    Breast cancer Maternal Aunt 54   Breast cancer Maternal Aunt 54   Cancer Other    Stroke Other     Social History   Socioeconomic History   Marital status: Significant Other    Spouse name: Not on file   Number of children: 2   Years of education: Not on file   Highest education level: Not on file  Occupational History   Occupation: Civil engineer, [employer]  Tobacco Use   Smoking status: Never   Smokeless tobacco: Never  Vaping Use   Vaping Use: Some days  Substance and Sexual Activity   Alcohol use: Yes    Comment: occasional/rare   Drug use: Yes    Types: Marijuana    Comment: occ   Sexual activity: Not Currently    Birth control/protection: None  Other Topics Concern    Not on file  Social History Narrative   Left handed   Caffeine- 1 cup    Social Determinants of Health   Financial Resource Strain: Not on file  Food Insecurity: No Food Insecurity (11/22/2022)   Hunger Vital Sign    Worried About Running Out of Food in the Last Year: Never true    Ran Out of Food in the Last Year: Never true  Transportation Needs: No Transportation Needs (11/22/2022)   PRAPARE - Hydrologist (Medical): No    Lack of Transportation (Non-Medical): No  Physical Activity: Not on file  Stress: Not on file  Social Connections: Not on file  Intimate Partner Violence: Not At Risk (11/22/2022)   Humiliation, Afraid, Rape, and Kick questionnaire    Fear of Current or Ex-Partner: No    Emotionally Abused: No    Physically Abused: No    Sexually Abused: No   Review of Systems: Gen: Denies fever, sweats or chills. No weight loss.  CV: Denies chest pain, palpitations or edema. Resp: Denies cough, shortness of breath of hemoptysis.  GI: Se HPI. GU : Denies urinary burning, blood in urine, increased urinary frequency or incontinence. MS: Denies joint pain, muscles aches or weakness. Derm: Denies rash, itchiness, skin lesions or unhealing ulcers. Psych: + Anxiety and depression.  Heme: Denies easy bruising, bleeding. Neuro:  Denies headaches, dizziness or paresthesias. Endo:  Denies any problems with DM, thyroid or adrenal function.  Physical Exam: Vital signs in last 24 hours: Temp:  [98.1 F (36.7 C)-99.6 F (37.6 C)] 98.6 F (37 C) (01/25 0600) Pulse Rate:  [72-81] 81 (01/25 0600) Resp:  [16-18] 18 (01/24 2124) BP: (114-132)/(61-78) 122/78 (01/25 0600) SpO2:  [98 %-100 %] 98 % (01/25 0600) Weight:  [48.2 kg] 48.2 kg (01/24 2124) Last BM Date : 11/22/22 General:  Alert 32 year old female in no acute distress. Head:  Normocephalic and atraumatic. Eyes:  No scleral icterus. Conjunctiva pink. Ears:  Normal auditory acuity. Nose:  No  deformity, discharge or lesions. Mouth:  Dentition intact. No ulcers or lesions.  Neck:  Supple. No lymphadenopathy or thyromegaly.  Lungs: Breath sounds clear throughout. No wheezes, rhonchi or crackles.  Heart: Regular rate and rhythm, no murmurs. Abdomen: Soft, nondistended.  Nontender.  Positive bowel sounds to all 4 quadrants. Rectal: Deferred. Musculoskeletal:  Symmetrical without gross deformities.  Pulses:  Normal pulses noted. Extremities:  Without clubbing or edema. Neurologic:  Alert and  oriented x 4. No focal deficits.  Skin:  Intact without significant lesions or rashes. Psych:  Alert and cooperative. Normal mood and affect.  Intake/Output from previous day: 01/24 0701 - 01/25 0700 In: 2150.3 [IV Piggyback:2150.3] Out: -  Intake/Output this shift: No intake/output data recorded.  Lab Results: Recent Labs    11/20/22 1308 11/22/22 0825 11/23/22 0645  WBC 12.6* 14.4* 5.7  HGB 11.4* 10.8* 8.8*  HCT 34.9* 33.3* 27.1*  PLT 353 312 192   BMET Recent Labs    11/20/22 1308 11/22/22 0825 11/23/22 0645  NA 140 141 139  K 4.1 3.4* 3.1*  CL 110 106 108  CO2 20* 24 25  GLUCOSE 165* 129* 91  BUN 16 16 8   CREATININE 0.79 0.90 0.90  CALCIUM 9.8 9.0 8.5*   LFT Recent Labs    11/23/22 0645  PROT 5.6*  ALBUMIN 3.3*  AST 19  ALT 17  ALKPHOS 32*  BILITOT 0.5   PT/INR No results for input(s): "LABPROT", "INR" in the last 72 hours. Hepatitis Panel No results for input(s): "HEPBSAG", "HCVAB", "HEPAIGM", "HEPBIGM" in the last 72 hours.    Studies/Results: NM Hepatobiliary Liver Func  Result Date: 11/23/2022 CLINICAL DATA:  Abdominal pain.  Abnormal ultrasound examination. EXAM: NUCLEAR MEDICINE HEPATOBILIARY IMAGING TECHNIQUE: Sequential images of the abdomen were obtained out to 60 minutes following intravenous administration of radiopharmaceutical. RADIOPHARMACEUTICALS:  5.1 mCi Tc-43m  Choletec IV COMPARISON:  CT scan 11/20/2022 and ultrasound 11/22/2022  FINDINGS: Prompt uptake and biliary excretion of activity by the liver is seen. Gallbladder activity is visualized, consistent with patency of cystic duct. Biliary activity passes into small bowel, consistent with patent common bile duct.  IMPRESSION: Normal biliary patency study. Electronically Signed   By: Rudie Meyer M.D.   On: 11/23/2022 11:51   US Abdomen Limited RUQ (LIVER/GB)  Result Date: 11/22/2022 CLINICAL DATA:  [id number] Epigastric abdominal pain [id number] EXAM: ULTRASOUND ABDOMEN LIMITED COMPARISON:  None Available. FINDINGS: The liver demonstrates normal parenchymal echogenicity and homogeneous texture without focal hepatic parenchymal lesions or intrahepatic ductal dilatation. Gallbladder wall is thickened and edematous. No pericholecystic fluid or shadowing stones identified. CBD measured 0.2cm. IMPRESSION: Abnormal gallbladder with an edematous thickened wall. If indicated, to evaluate for cholecystitis, consider HIDA scan. Otherwise unremarkable right upper quadrant. Electronically Signed   By: Layla Maw M.D.   On: 11/22/2022 09:26    IMPRESSION/PLAN:  2) 32 year old female with recurrent episodic N/V. Patient denies having epigastric pain, describes having a "ball of nausea" to the upper abdomen which comes and goes. Seen in the ED x 4 over the past year for the same symptoms. Normal/low LFTs. Normal lipase level. WBC 12.6 -> 14.4 -> 5.7. CTAP with contrast without acute intra-abdominal/pelvic pathology to explain her symptoms. RUQ sonogram identified an abnormal gallbladder with edematous thickened wall otherwise was unremarkable. On Cipro and Flagyl IV for possible acalculous cholecystitis. HIDA scan today without evidence of cholecystitis.   -Pantoprazole 40mg  po QD -Phenergan 12.5 mg 1 p.o. every 12 hours as needed -Diet as tolerated -Patient does not wish to pursue endoscopic evaluation during this hospital admission -No NSAID use -Recommend outpatient GI follow-up, consider  eventual EGD if patient willing and gastric empty study as outpatient  -Await further recommendations per Dr.  2) Normocytic anemia. No overt GI bleeding. -CBC, iron panel in am   Lavon Paganini  11/23/2022, 1:38PM    Attending physician's note  I have taken a history, reviewed the chart and examined the patient. I performed a substantive portion of this encounter, including complete performance of at least one of the key components, in conjunction with the APP. I agree with the APP's note, impression and recommendations.    32 year old very pleasant female presented with acute onset nausea, vomiting and epigastric abdominal pain. CT abdomen pelvis negative for acute intra-abdominal pathology. Right upper quadrant ultrasound showed gallbladder wall thickening but subsequent HIDA scan was normal  Patient currently is asymptomatic, tolerating p.o. intake.  She feels better after she had a bowel movement.  History of intermittent NSAID use  Noted significant drop in Hgb, some component of hemodilution but will need further work up No overt bleeding  Discussed possible EGD to exclude peptic/gastroduodenal ulcer, H. pylori gastritis  She wants to go home and does not want to undergo additional procedures or EGD as inpatient, will arrange for outpatient GI follow-up which she is willing to do. Avoid NSAIDs Pantoprazole 40 mg daily Antireflux measures  Okay to discharge home from GI standpoint   The patient was provided an opportunity to ask questions and all were answered. The patient agreed with the plan and demonstrated an understanding of the instructions.  37 , MD [phone]

## 2022-12-13 ENCOUNTER — Ambulatory Visit: Payer: Commercial Managed Care - HMO | Admitting: Family Medicine

## 2023-01-01 NOTE — Progress Notes (Incomplete)
Denise [name]    NX:[id number]    Jun 09, 1991  Primary Care Physician:Morrow, Delfino Lovett, NP  Referring Physician: Maximiano Coss, NP [place],  [place] [address]   Chief complaint:  ***No chief complaint on file.    HPI: Patient has had 3 GI- related ED visits in the past year. Last one being on 11/22/2022. Per that note, patient was experiencing recurrent, worsening abdominal pain, nausea, and vomiting. CT abdomen pelvis showed no acute findings abdominal ultrasound showed abnormal gallbladder with thickened wall.       Denise [name] is a 32 y.o. female presenting to clinic today for consult for RUQ pain at the request of NP Maximiano Coss.  Today,   GI Hx:    US Abdomen Limited RUQ  11/22/22 Abnormal gallbladder with an edematous thickened wall. If indicated, to evaluate for cholecystitis, consider HIDA scan. Otherwise unremarkable right upper quadrant.   CT Abdomen Pelvis W Contrast 11/20/22 No acute findings in the abdomen or pelvis. Normal appendix.    Current Outpatient Medications:    FLUoxetine (PROZAC) 20 MG capsule, Take 1 capsule (20 mg total) by mouth daily., Disp: 90 capsule, Rfl: 3   norgestimate-ethinyl estradiol (ORTHO-CYCLEN) 0.25-35 MG-MCG tablet, Take 1 tablet by mouth daily., Disp: , Rfl:    ondansetron (ZOFRAN-ODT) 4 MG disintegrating tablet, Take 1 tablet (4 mg total) by mouth every 8 (eight) hours as needed for nausea or vomiting., Disp: 20 tablet, Rfl: 0   pantoprazole (PROTONIX) 40 MG tablet, Take 1 tablet (40 mg total) by mouth daily., Disp: 30 tablet, Rfl: 0   Ubrogepant (UBRELVY) 100 MG TABS, Take 100 mg by mouth every 2 (two) hours as needed. Maximum '200mg'$  a day., Disp: 16 tablet, Rfl: 11   valACYclovir (VALTREX) 500 MG tablet, Take 1 tablet (500 mg total) by mouth 2 (two) times daily., Disp: 60 tablet, Rfl: 6   Allergies as of 01/03/2023 - Review Complete 11/22/2022  Allergen Reaction Noted   Cephalosporins Anaphylaxis and  Hives 10/13/2014    Past Medical History:  Diagnosis Date   ADHD (attention deficit hyperactivity disorder)    Anxiety    Genital herpes     Past Surgical History:  Procedure Laterality Date   NO PAST SURGERIES      Family History  Problem Relation Age of Onset   Multiple sclerosis Mother    Breast cancer Mother 84   Skin cancer Father    Lung cancer Maternal Grandfather        smoker   Stroke Paternal Grandmother    Heart attack Paternal Grandfather    Multiple sclerosis Maternal Aunt    Breast cancer Maternal Aunt 54   Breast cancer Maternal Aunt 54   Cancer Other    Stroke Other     Social History   Socioeconomic History   Marital status: Significant Other    Spouse name: Not on file   Number of children: 2   Years of education: Not on file   Highest education level: Not on file  Occupational History   Occupation: Civil engineer, contracting [employer]  Tobacco Use   Smoking status: Never   Smokeless tobacco: Never  Vaping Use   Vaping Use: Some days  Substance and Sexual Activity   Alcohol use: Yes    Comment: occasional/rare   Drug use: Yes    Types: Marijuana    Comment: occ   Sexual activity: Not Currently    Birth control/protection:  None  Other Topics Concern   Not on file  Social History Narrative   Left handed   Caffeine- 1 cup    Social Determinants of Health   Financial Resource Strain: Not on file  Food Insecurity: No Food Insecurity (11/22/2022)   Hunger Vital Sign    Worried About Running Out of Food in the Last Year: Never true    Ran Out of Food in the Last Year: Never true  Transportation Needs: No Transportation Needs (11/22/2022)   PRAPARE - Hydrologist (Medical): No    Lack of Transportation (Non-Medical): No  Physical Activity: Not on file  Stress: Not on file  Social Connections: Not on file  Intimate Partner Violence: Not At Risk (11/22/2022)   Humiliation, Afraid, Rape, and Kick questionnaire    Fear of Current  or Ex-Partner: No    Emotionally Abused: No    Physically Abused: No    Sexually Abused: No      Review of systems: Review of Systems    Physical Exam: General: well-appearing ***  Eyes: sclera anicteric, no redness ENT: oral mucosa moist without lesions, no cervical or supraclavicular lymphadenopathy CV: RRR, no JVD, no peripheral edema Resp: clear to auscultation bilaterally, normal RR and effort noted GI: soft, no tenderness, with active bowel sounds. No guarding or palpable organomegaly noted. Skin; warm and dry, no rash or jaundice noted Neuro: awake, alert and oriented x 3. Normal gross motor function and fluent speech   Data Reviewed:  Reviewed labs, radiology imaging, old records and pertinent past GI work up   Assessment and Plan/Recommendations:  ***  This visit required *** minutes of patient care (this includes precharting, chart review, review of results, face-to-face time used for counseling as well as treatment plan and follow-up. The patient was provided an opportunity to ask questions and all were answered. The patient agreed with the plan and demonstrated an understanding of the instructions.  Damaris Hippo , MD  CC: Maximiano Coss, NP   Joretta Bachelor Johnston Ebbs as a scribe for Harl Bowie, MD.,have documented all relevant documentation on the behalf of Harl Bowie, MD,as directed by  Harl Bowie, MD while in the presence of Harl Bowie, MD.   I, Harl Bowie, MD, have reviewed all documentation for this visit. The documentation on 01/01/23 for the exam, diagnosis, procedures, and orders are all accurate and complete. ***

## 2023-01-03 ENCOUNTER — Ambulatory Visit: Payer: Medicaid Other | Admitting: Gastroenterology

## 2023-03-09 ENCOUNTER — Other Ambulatory Visit: Payer: Self-pay

## 2023-03-09 DIAGNOSIS — S6991XA Unspecified injury of right wrist, hand and finger(s), initial encounter: Secondary | ICD-10-CM | POA: Diagnosis not present

## 2023-03-09 DIAGNOSIS — S61411A Laceration without foreign body of right hand, initial encounter: Secondary | ICD-10-CM | POA: Diagnosis not present

## 2023-03-09 DIAGNOSIS — S61214A Laceration without foreign body of right ring finger without damage to nail, initial encounter: Secondary | ICD-10-CM | POA: Insufficient documentation

## 2023-03-09 DIAGNOSIS — W01198A Fall on same level from slipping, tripping and stumbling with subsequent striking against other object, initial encounter: Secondary | ICD-10-CM | POA: Insufficient documentation

## 2023-03-09 NOTE — ED Triage Notes (Addendum)
Pt to ED via POV c/o laceration to ring finger of right hand. Pt slipped and fell, hitting hand on broken bowl on floor. Pt UTD with tetanus. Bleeding under control, rewrapped with gauze

## 2023-03-10 ENCOUNTER — Emergency Department
Admission: EM | Admit: 2023-03-10 | Discharge: 2023-03-10 | Disposition: A | Discharge status: Home / Self Care | Payer: No Typology Code available for payment source | Attending: Emergency Medicine | Admitting: Emergency Medicine

## 2023-03-10 ENCOUNTER — Emergency Department: Payer: No Typology Code available for payment source

## 2023-03-10 DIAGNOSIS — S61411A Laceration without foreign body of right hand, initial encounter: Secondary | ICD-10-CM | POA: Diagnosis not present

## 2023-03-10 DIAGNOSIS — S61214A Laceration without foreign body of right ring finger without damage to nail, initial encounter: Secondary | ICD-10-CM | POA: Diagnosis not present

## 2023-03-10 MED ORDER — IBUPROFEN 600 MG PO TABS
600.0000 mg | ORAL_TABLET | Freq: Once | ORAL | Status: AC
  Administered 2023-03-10: 600 mg via ORAL
  Filled 2023-03-10: qty 1

## 2023-03-10 NOTE — ED Provider Notes (Signed)
[facility] Provider Note   Event Date/Time   First MD Initiated Contact with Patient 03/10/23 [phone]     (approximate) History  Laceration  HPI MARVEL RASH is a 32 y.o. female with a past medical history of ADHD, generalized anxiety disorder, major depressive disorder, and migraines who presents complaining of of a laceration to the ring finger of the right hand.  Patient states that she slipped and fell hitting her hand on a broken bowl on the floor.  Patient states she is up-to-date with tetanus.  Patient's bleeding is under control after it was wrapped with gauze in triage.  Patient denies any weakness/numbness/paresthesias distal to this finger or any color change. ROS: Patient currently denies any vision changes, tinnitus, difficulty speaking, facial droop, sore throat, chest pain, shortness of breath, abdominal pain, nausea/vomiting/diarrhea, dysuria, or weakness/numbness/paresthesias in any extremity   Physical Exam  Triage Vital Signs: ED Triage Vitals  Enc Vitals Group     BP 03/09/23 2345 118/88     Pulse Rate 03/09/23 2345 96     Resp 03/09/23 2345 20     Temp 03/09/23 2345 98.2 F (36.8 C)     Temp Source 03/09/23 2345 Oral     SpO2 03/09/23 2345 100 %     Weight 03/09/23 2348 110 lb (49.9 kg)     Height 03/09/23 2348 5\' 3"  (1.6 m)     Head Circumference --      Peak Flow --      Pain Score 03/09/23 2347 6     Pain Loc --      Pain Edu? --      Excl. in GC? --    Most recent vital signs: Vitals:   03/09/23 2345  BP: 118/88  Pulse: 96  Resp: 20  Temp: 98.2 F (36.8 C)  SpO2: 100%   General: Awake, oriented x4. CV:  Good peripheral perfusion.  Resp:  Normal effort.  Abd:  No distention.  Other:  Middle-aged well-developed, well-nourished Caucasian female laying in bed in mild distress secondary to pain.  There is a 1 cm curvilinear laceration along the palmar aspect just distal to the MTP joint ED Results / Procedures / Treatments   Labs (all labs ordered are listed, but only abnormal results are displayed) Labs Reviewed - No data to display RADIOLOGY ED MD interpretation: X-ray of the right hand interpreted independently by me shows no evidence of acute abnormalities -Agree with radiology assessment Official radiology report(s): DG Hand Complete Right  Result Date: 03/10/2023 CLINICAL DATA:  laceration eval fb Pt states that she fell at work and there was broken glass on the floor. States her hand landed within the glass and her third digit of right hand was cut open EXAM: RIGHT HAND - COMPLETE 3+ VIEW COMPARISON:  None Available. FINDINGS: There is no evidence of fracture or dislocation. There is no evidence of arthropathy or other focal bone abnormality. Soft tissues are unremarkable. No retained radiopaque foreign body. IMPRESSION: Negative. Electronically Signed   By: Tish Frederickson M.D.   On: 03/10/2023 00:33   PROCEDURES: Critical Care performed: No ..Laceration Repair  Date/Time: 03/10/2023 4:51 AM  Performed by: Merwyn Katos, MD Authorized by: Merwyn Katos, MD   Consent:    Consent obtained:  Verbal   Consent given by:  Patient   Risks, benefits, and alternatives were discussed: yes     Risks discussed:  Infection, pain, retained foreign body, need for additional repair,  poor cosmetic result, tendon damage, vascular damage, poor wound healing and nerve damage   Alternatives discussed:  No treatment, delayed treatment, observation and referral Universal protocol:    Immediately prior to procedure, a time out was called: yes     Patient identity confirmed:  Verbally with patient Anesthesia:    Anesthesia method:  Local infiltration Laceration details:    Length (cm):  1   Depth (mm):  3 Pre-procedure details:    Preparation:  Patient was prepped and draped in usual sterile fashion and imaging obtained to evaluate for foreign bodies Exploration:    Wound exploration: entire depth of wound  visualized     Contaminated: no   Treatment:    Area cleansed with:  Povidone-iodine and saline   Amount of cleaning:  Standard   Irrigation solution:  Sterile saline   Irrigation volume:  50   Irrigation method:  Syringe   Visualized foreign bodies/material removed: no   Skin repair:    Repair method:  Tissue adhesive Approximation:    Approximation:  Close Repair type:    Repair type:  Simple Post-procedure details:    Dressing:  Open (no dressing)   Procedure completion:  Tolerated well, no immediate complications  MEDICATIONS ORDERED IN ED: Medications - No data to display IMPRESSION / MDM / ASSESSMENT AND PLAN / ED COURSE  I reviewed the triage vital signs and the nursing notes.                             Patient's presentation is most consistent with acute presentation with potential threat to life or bodily function. Patient had a laceration that was repaired in the ED after copious irrigation.  Please see laceration procedure note for further details.  After exploration of the wound, there was no evidence of a retained foreign body. No evidence of underlying fracture. TDAP: UTD Interventions: Defer ABX at this time given location, event time, and patient without surrounding signs of infection. Disposition: Discharge. Patient has been given strict wound return precautions and instructions to follow up with their PMD in 2 days for a wound recheck.   FINAL CLINICAL IMPRESSION(S) / ED DIAGNOSES   Final diagnoses:  None   Rx / DC Orders   ED Discharge Orders     None      Note:  This document was prepared using Dragon voice recognition software and may include unintentional dictation errors.   Merwyn Katos, MD 03/10/23 [phone]

## 2023-04-17 ENCOUNTER — Other Ambulatory Visit (HOSPITAL_BASED_OUTPATIENT_CLINIC_OR_DEPARTMENT_OTHER): Payer: Self-pay

## 2023-04-17 ENCOUNTER — Other Ambulatory Visit: Payer: Self-pay

## 2023-04-17 ENCOUNTER — Encounter (HOSPITAL_BASED_OUTPATIENT_CLINIC_OR_DEPARTMENT_OTHER): Payer: Self-pay | Admitting: Emergency Medicine

## 2023-04-17 ENCOUNTER — Emergency Department (HOSPITAL_BASED_OUTPATIENT_CLINIC_OR_DEPARTMENT_OTHER)
Admission: EM | Admit: 2023-04-17 | Discharge: 2023-04-17 | Disposition: A | Discharge status: Home / Self Care | Payer: 59 | Attending: Emergency Medicine | Admitting: Emergency Medicine

## 2023-04-17 DIAGNOSIS — R112 Nausea with vomiting, unspecified: Secondary | ICD-10-CM | POA: Insufficient documentation

## 2023-04-17 LAB — CBC WITH DIFFERENTIAL/PLATELET
Abs Immature Granulocytes: 0.02 10*3/uL (ref 0.00–0.07)
Basophils Absolute: 0 10*3/uL (ref 0.0–0.1)
Basophils Relative: 0 %
Eosinophils Absolute: 0 10*3/uL (ref 0.0–0.5)
Eosinophils Relative: 0 %
HCT: 34 % — ABNORMAL LOW (ref 36.0–46.0)
Hemoglobin: 11.3 g/dL — ABNORMAL LOW (ref 12.0–15.0)
Immature Granulocytes: 0 %
Lymphocytes Relative: 15 %
Lymphs Abs: 1.1 10*3/uL (ref 0.7–4.0)
MCH: 27.6 pg (ref 26.0–34.0)
MCHC: 33.2 g/dL (ref 30.0–36.0)
MCV: 82.9 fL (ref 80.0–100.0)
Monocytes Absolute: 0.4 10*3/uL (ref 0.1–1.0)
Monocytes Relative: 5 %
Neutro Abs: 5.7 10*3/uL (ref 1.7–7.7)
Neutrophils Relative %: 80 %
Platelets: 226 10*3/uL (ref 150–400)
RBC: 4.1 MIL/uL (ref 3.87–5.11)
RDW: 15.3 % (ref 11.5–15.5)
WBC: 7.3 10*3/uL (ref 4.0–10.5)
nRBC: 0 % (ref 0.0–0.2)

## 2023-04-17 LAB — COMPREHENSIVE METABOLIC PANEL
ALT: 8 U/L (ref 0–44)
AST: 12 U/L — ABNORMAL LOW (ref 15–41)
Albumin: 4.7 g/dL (ref 3.5–5.0)
Alkaline Phosphatase: 49 U/L (ref 38–126)
Anion gap: 8 (ref 5–15)
BUN: 12 mg/dL (ref 6–20)
CO2: 23 mmol/L (ref 22–32)
Calcium: 9.7 mg/dL (ref 8.9–10.3)
Chloride: 106 mmol/L (ref 98–111)
Creatinine, Ser: 0.82 mg/dL (ref 0.44–1.00)
GFR, Estimated: >60 mL/min (ref >60)
Glucose, Bld: 99 mg/dL (ref 70–99)
Potassium: 3.9 mmol/L (ref 3.5–5.1)
Sodium: 137 mmol/L (ref 135–145)
Total Bilirubin: 0.5 mg/dL (ref 0.3–1.2)
Total Protein: 7.4 g/dL (ref 6.5–8.1)

## 2023-04-17 LAB — LIPASE, BLOOD: Lipase: <10 U/L — ABNORMAL LOW (ref 11–51)

## 2023-04-17 LAB — PREGNANCY, URINE: Preg Test, Ur: NEGATIVE

## 2023-04-17 MED ORDER — METOCLOPRAMIDE HCL 5 MG/ML IJ SOLN
10.0000 mg | Freq: Once | INTRAMUSCULAR | Status: AC
  Administered 2023-04-17: 10 mg via INTRAVENOUS
  Filled 2023-04-17: qty 2

## 2023-04-17 MED ORDER — SODIUM CHLORIDE 0.9 % IV BOLUS
1000.0000 mL | Freq: Once | INTRAVENOUS | Status: AC
  Administered 2023-04-17: 1000 mL via INTRAVENOUS

## 2023-04-17 NOTE — ED Notes (Signed)
Pt discharged home and given discharge paperwork. Opportunities given for questions. Pt verbalizes understanding. PIV removed x1. Treyvonne Tata R , RN 

## 2023-04-17 NOTE — Discharge Instructions (Signed)
Your laboratory results today were within normal limits.  Please continue symptomatic treatment at home, if you experience any worsening symptoms you may return to the emergency department.

## 2023-04-17 NOTE — ED Provider Notes (Signed)
[place] EMERGENCY DEPARTMENT AT [facility] Provider Note   CSN: [id number] Arrival date & time: 04/17/23  4401     History  Chief Complaint  Patient presents with   Emesis    Denise [name] is a 32 y.o. female.  32 year old female with a past medical history of nausea and vomiting presents to the ED with a chief complaint of nausea and vomiting which began around midnight this morning.  She reports going to bed, feeling nauseated upon waking up, having multiple episodes of nonbilious, nonbloody emesis.  Now has had ongoing vomiting since.  She had a similar episode in the past, where she had to have a HIDA scan and did not have her gallbladder removed.  Symptoms have improved since arriving to the emergency department.  She tried taking some Dramamine, Phenergan without any improvement in her symptoms.  She is not having any abdominal pain, no urinary symptoms, no vaginal discharge.  Patient is followed closely by gastroenterologist, does have an appointment with them in July.  The history is provided by the patient.  Emesis Associated symptoms: no abdominal pain, no chills and no fever        Home Medications Prior to Admission medications   Medication Sig Start Date End Date Taking? Authorizing Provider  FLUoxetine (PROZAC) 20 MG capsule Take 1 capsule (20 mg total) by mouth daily. 04/24/22   Janeece Agee, NP  norgestimate-ethinyl estradiol (ORTHO-CYCLEN) 0.25-35 MG-MCG tablet Take 1 tablet by mouth daily. 02/21/22   [provider]  ondansetron (ZOFRAN-ODT) 4 MG disintegrating tablet Take 1 tablet (4 mg total) by mouth every 8 (eight) hours as needed for nausea or vomiting. 11/20/22   Rancour, Jeannett Senior, MD  pantoprazole (PROTONIX) 40 MG tablet Take 1 tablet (40 mg total) by mouth daily. 11/23/22 12/23/22  Jerald Kief, MD  Ubrogepant (UBRELVY) 100 MG TABS Take 100 mg by mouth every 2 (two) hours as needed. Maximum 200mg  a day. 06/04/22   Anson Fret, MD   valACYclovir (VALTREX) 500 MG tablet Take 1 tablet (500 mg total) by mouth 2 (two) times daily. 03/13/22   Janeece Agee, NP      Allergies    Cephalosporins    Review of Systems   Review of Systems  Constitutional:  Negative for chills and fever.  Respiratory:  Negative for shortness of breath.   Cardiovascular:  Negative for chest pain.  Gastrointestinal:  Positive for nausea and vomiting. Negative for abdominal pain.  Genitourinary:  Negative for difficulty urinating and flank pain.  All other systems reviewed and are negative.   Physical Exam Updated Vital Signs BP 118/74 (BP Location: Left Arm)   Pulse 68   Temp 98.3 F (36.8 C) (Oral)   Resp 16   Ht 5\' 3"  (1.6 m)   Wt 49.9 kg   LMP 03/20/2023   SpO2 100%   BMI 19.49 kg/m  Physical Exam Vitals and nursing note reviewed.  Constitutional:      General: She is not in acute distress.    Appearance: She is well-developed.  HENT:     Head: Normocephalic and atraumatic.     Mouth/Throat:     Pharynx: No oropharyngeal exudate.  Eyes:     Pupils: Pupils are equal, round, and reactive to light.  Cardiovascular:     Rate and Rhythm: Regular rhythm.     Heart sounds: Normal heart sounds.  Pulmonary:     Effort: Pulmonary effort is normal. No respiratory distress.  Breath sounds: Normal breath sounds.  Abdominal:     General: Bowel sounds are normal. There is no distension.     Palpations: Abdomen is soft.     Tenderness: There is no abdominal tenderness.     Comments: No guarding, no rebound.  Musculoskeletal:        General: No tenderness or deformity.     Cervical back: Normal range of motion.     Right lower leg: No edema.     Left lower leg: No edema.  Skin:    General: Skin is warm and dry.  Neurological:     Mental Status: She is alert and oriented to person, place, and time.     ED Results / Procedures / Treatments   Labs (all labs ordered are listed, but only abnormal results are  displayed) Labs Reviewed  CBC WITH DIFFERENTIAL/PLATELET - Abnormal; Notable for the following components:      Result Value   Hemoglobin 11.3 (*)    HCT 34.0 (*)    All other components within normal limits  COMPREHENSIVE METABOLIC PANEL - Abnormal; Notable for the following components:   AST 12 (*)    All other components within normal limits  LIPASE, BLOOD - Abnormal; Notable for the following components:   Lipase <10 (*)    All other components within normal limits  PREGNANCY, URINE    EKG None  Radiology No results found.  Procedures Procedures    Medications Ordered in ED Medications  metoCLOPramide (REGLAN) injection 10 mg (10 mg Intravenous Given 04/17/23 1044)  sodium chloride 0.9 % bolus 1,000 mL (0 mLs Intravenous Stopped 04/17/23 1140)    ED Course/ Medical Decision Making/ A&P                             Medical Decision Making Amount and/or Complexity of Data Reviewed Labs: ordered.  Risk Prescription drug management.   This patient presents to the ED for concern of nausea and vomiting, this involves a number of treatment options, and is a complaint that carries with it a high risk of complications and morbidity.  The differential diagnosis includes cholecystitis, cyclical vomiting, pregnancy.   Co morbidities: Discussed in HPI   Brief History:  See HPI.   EMR reviewed including pt PMHx, past surgical history and past visits to ER.   See HPI for more details   Lab Tests:  I ordered and independently interpreted labs.  The pertinent results include:    I personally reviewed all laboratory work and imaging. Metabolic panel without any acute abnormality specifically kidney function within normal limits and no significant electrolyte abnormalities. CBC without leukocytosis or significant anemia. Pregnancy test is negative.    Imaging Studies:  No imaging studies ordered for this patient   Medicines ordered:  I ordered medication  including reglan, bolus  for symptomatic treatment Reevaluation of the patient after these medicines showed that the patient improved I have reviewed the patients home medicines and have made adjustments as needed  Reevaluation:  After the interventions noted above I re-evaluated patient and found that they have :improved  Social Determinants of Health:  The patient's social determinants of health were a factor in the care of this patient  Problem List / ED Course:  Patient here with nausea and vomiting which began around midnight yesterday, reports tried Zofran that she had at home without any improvement in symptoms.  Patient does have a recurrent history  of this.  Reports she previously had to be admitted for HIDA scan, they told that her gallbladder was likely acting up however this was never addressed.  She does follow-up with gastroenterology, has an appointment scheduled with them in July.  She arrived to the ED hemodynamically stable with stable vital signs, no fever, no abdominal pain.  Her labs are reassuring, no electrolyte derangement, LFTs are within normal limits.  No leukocytosis, hemoglobin is normal.  Pregnancy test is also negative.  I do suspect likely acute on chronic nausea and vomiting that is been ongoing.  No signs of abdominal pain, elevated LFTs to suggest cholecystitis at this time.  Perhaps viral illness although she did have much potato last night at home which she made herself.  She was given Reglan, bolus while in the emergency department, she was also p.o. trialed. Patient tolerating p.o. adequately, reports improvement in her symptoms, does have close follow-up with gastroenterology.  We discussed strict return precautions.  Patient is hemodynamically stable for discharge.   Dispostion:  After consideration of the diagnostic results and the patients response to treatment, I feel that the patent would benefit from follow-up with gastroenterology Dr.  Lavon Paganini.   Portions of this note were generated with Dragon dictation software. Dictation errors may occur despite best attempts at proofreading.   Final Clinical Impression(s) / ED Diagnoses Final diagnoses:  Nausea and vomiting, unspecified vomiting type    Rx / DC Orders ED Discharge Orders     None         Claude Manges, PA-C 04/17/23 1220    Ernie Avena, MD 04/17/23 1238

## 2023-04-17 NOTE — ED Triage Notes (Signed)
Pt arrives pov, steady gait with c/o n/v that started at ~ 1230 am today. Pt deneis ABD pain

## 2023-06-17 ENCOUNTER — Other Ambulatory Visit: Payer: Self-pay

## 2023-06-17 ENCOUNTER — Emergency Department (HOSPITAL_BASED_OUTPATIENT_CLINIC_OR_DEPARTMENT_OTHER)
Admission: EM | Admit: 2023-06-17 | Discharge: 2023-06-17 | Disposition: A | Discharge status: Home / Self Care | Payer: 59 | Attending: Emergency Medicine | Admitting: Emergency Medicine

## 2023-06-17 ENCOUNTER — Encounter (HOSPITAL_BASED_OUTPATIENT_CLINIC_OR_DEPARTMENT_OTHER): Payer: Self-pay

## 2023-06-17 DIAGNOSIS — L0231 Cutaneous abscess of buttock: Secondary | ICD-10-CM | POA: Insufficient documentation

## 2023-06-17 HISTORY — DX: Depression, unspecified: F32.A

## 2023-06-17 MED ORDER — DOXYCYCLINE HYCLATE 100 MG PO CAPS: 100.0000 mg | ORAL_CAPSULE | Freq: Two times a day (BID) | ORAL | 0 refills | Status: DC

## 2023-06-17 MED ORDER — DOXYCYCLINE HYCLATE 100 MG PO TABS
100.0000 mg | ORAL_TABLET | Freq: Once | ORAL | Status: AC
  Administered 2023-06-17: 100 mg via ORAL
  Filled 2023-06-17: qty 1

## 2023-06-17 MED ORDER — LIDOCAINE HCL (PF) 1 % IJ SOLN
5.0000 mL | Freq: Once | INTRAMUSCULAR | Status: AC
  Administered 2023-06-17: 5 mL via INTRADERMAL
  Filled 2023-06-17: qty 5

## 2023-06-17 NOTE — Discharge Instructions (Signed)
Begin taking doxycycline as prescribed.  Apply warm compresses or perform sitz bath's as frequently as possible for the next several days.  Follow-up with primary doctor if not improving and return to the ER if symptoms significantly worsen or change.

## 2023-06-17 NOTE — ED Triage Notes (Signed)
Pt reports abscess to her right buttock for the past 2 days. Pt reports redness and painful to touch and to sit.

## 2023-06-17 NOTE — ED Provider Notes (Signed)
[place] EMERGENCY DEPARTMENT AT [facility] Provider Note   CSN: [id number] Arrival date & time: 06/17/23  0121     History  Chief Complaint  Patient presents with   Abscess    Denise [name] is a 32 y.o. female.  Patient is a 32 year old female presenting with complaints of a swollen, tender area to her right buttock.  This has been worsening over the past several days.  She has been trying sitz bath's with little relief.  No fevers or chills.  No injury or trauma.  The history is provided by the patient.       Home Medications Prior to Admission medications   Medication Sig Start Date End Date Taking? Authorizing Provider  FLUoxetine (PROZAC) 20 MG capsule Take 1 capsule (20 mg total) by mouth daily. 04/24/22   Janeece Agee, NP  norgestimate-ethinyl estradiol (ORTHO-CYCLEN) 0.25-35 MG-MCG tablet Take 1 tablet by mouth daily. 02/21/22   [provider]  ondansetron (ZOFRAN-ODT) 4 MG disintegrating tablet Take 1 tablet (4 mg total) by mouth every 8 (eight) hours as needed for nausea or vomiting. 11/20/22   Rancour, Jeannett Senior, MD  pantoprazole (PROTONIX) 40 MG tablet Take 1 tablet (40 mg total) by mouth daily. 11/23/22 12/23/22  Jerald Kief, MD  Ubrogepant (UBRELVY) 100 MG TABS Take 100 mg by mouth every 2 (two) hours as needed. Maximum 200mg  a day. 06/04/22   Anson Fret, MD  valACYclovir (VALTREX) 500 MG tablet Take 1 tablet (500 mg total) by mouth 2 (two) times daily. 03/13/22   Janeece Agee, NP      Allergies    Cephalosporins    Review of Systems   Review of Systems  All other systems reviewed and are negative.   Physical Exam Updated Vital Signs BP 110/67   Pulse 87   Temp 98.5 F (36.9 C)   Resp 18   Ht 5\' 3"  (1.6 m)   Wt 49.9 kg   LMP 06/14/2023   SpO2 99%   BMI 19.49 kg/m  Physical Exam Vitals and nursing note reviewed.  Constitutional:      Appearance: Normal appearance.  Pulmonary:     Effort: Pulmonary effort is  normal.  Skin:    General: Skin is warm and dry.     Comments: To the right buttock, there is an erythematous area measuring approximately 5 cm round with a central area of fluctuance measuring approximately 2-1/2 cm.  Neurological:     Mental Status: She is alert and oriented to person, place, and time.     ED Results / Procedures / Treatments   Labs (all labs ordered are listed, but only abnormal results are displayed) Labs Reviewed - No data to display  EKG None  Radiology No results found.  Procedures Procedures  INCISION AND DRAINAGE Performed by: Geoffery Lyons Consent: Verbal consent obtained. Risks and benefits: risks, benefits and alternatives were discussed Type: abscess  Body area: right buttock  Anesthesia: local infiltration  Incision was made with a scalpel.  Local anesthetic: lidocaine 1% without epinephrine  Anesthetic total: 2 ml  Complexity: complex Blunt dissection to break up loculations  Drainage: purulent  Drainage amount: mild  Packing material: No packing placed  Patient tolerance: Patient tolerated the procedure well with no immediate complications.    Medications Ordered in ED Medications  doxycycline (VIBRA-TABS) tablet 100 mg (has no administration in time range)  lidocaine (PF) (XYLOCAINE) 1 % injection 5 mL (5 mLs Intradermal Given 06/17/23 0320)  ED Course/ Medical Decision Making/ A&P  Patient presenting with an abscess to the right buttock that was incised and drained as above.  Patient to be discharged with doxycycline, warm compresses/sitz bath's, and return as needed.  Final Clinical Impression(s) / ED Diagnoses Final diagnoses:  None    Rx / DC Orders ED Discharge Orders     None         Geoffery Lyons, MD 06/17/23 [phone]

## 2023-06-26 ENCOUNTER — Ambulatory Visit: Payer: 59 | Admitting: Gastroenterology

## 2023-07-16 NOTE — Progress Notes (Unsigned)
New Patient Office Visit  Subjective    Patient ID: Denise [name], female    DOB: [birth year]  Age: 32 y.o. MRN: [id number]  CC: No chief complaint on file.   HPI JENNICE MULRONEY presents to establish care with new provider.   Patients previous primary care provider was Janeece Agee, NP. Last visit was 04/24/2022.   Outpatient Encounter Medications as of 07/17/2023  Medication Sig   doxycycline (VIBRAMYCIN) 100 MG capsule Take 1 capsule (100 mg total) by mouth 2 (two) times daily. One po bid x 7 days   FLUoxetine (PROZAC) 20 MG capsule Take 1 capsule (20 mg total) by mouth daily.   norgestimate-ethinyl estradiol (ORTHO-CYCLEN) 0.25-35 MG-MCG tablet Take 1 tablet by mouth daily.   ondansetron (ZOFRAN-ODT) 4 MG disintegrating tablet Take 1 tablet (4 mg total) by mouth every 8 (eight) hours as needed for nausea or vomiting.   pantoprazole (PROTONIX) 40 MG tablet Take 1 tablet (40 mg total) by mouth daily.   Ubrogepant (UBRELVY) 100 MG TABS Take 100 mg by mouth every 2 (two) hours as needed. Maximum 200mg  a day.   valACYclovir (VALTREX) 500 MG tablet Take 1 tablet (500 mg total) by mouth 2 (two) times daily.   No facility-administered encounter medications on file as of 07/17/2023.    Past Medical History:  Diagnosis Date   ADHD (attention deficit hyperactivity disorder)    Anxiety    Depression    Genital herpes     Past Surgical History:  Procedure Laterality Date   NO PAST SURGERIES      Family History  Problem Relation Age of Onset   Multiple sclerosis Mother    Breast cancer Mother 66   Skin cancer Father    Lung cancer Maternal Grandfather        smoker   Stroke Paternal Grandmother    Heart attack Paternal Grandfather    Multiple sclerosis Maternal Aunt    Breast cancer Maternal Aunt 28   Breast cancer Maternal Aunt 35   Cancer Other    Stroke Other     Social History   Socioeconomic History   Marital status: Significant Other    Spouse name: Not on  file   Number of children: 2   Years of education: Not on file   Highest education level: Not on file  Occupational History   Occupation: Training and development officer [employer]  Tobacco Use   Smoking status: Never   Smokeless tobacco: Never  Vaping Use   Vaping status: Some Days   Substances: Nicotine, CBD, Flavoring  Substance and Sexual Activity   Alcohol use: Yes    Comment: occasional/rare   Drug use: Yes    Types: Marijuana    Comment: occ   Sexual activity: Yes    Birth control/protection: None  Other Topics Concern   Not on file  Social History Narrative   Left handed   Caffeine- 1 cup    Social Determinants of Health   Financial Resource Strain: Not on file  Food Insecurity: No Food Insecurity (11/22/2022)   Hunger Vital Sign    Worried About Running Out of Food in the Last Year: Never true    Ran Out of Food in the Last Year: Never true  Transportation Needs: No Transportation Needs (11/22/2022)   PRAPARE - Administrator, Civil Service (Medical): No    Lack of Transportation (Non-Medical): No  Physical Activity: Not on file  Stress: Not on file  Social Connections: Not  on file  Intimate Partner Violence: Not At Risk (11/22/2022)   Humiliation, Afraid, Rape, and Kick questionnaire    Fear of Current or Ex-Partner: No    Emotionally Abused: No    Physically Abused: No    Sexually Abused: No    ROS See HPI above    Objective    LMP 06/14/2023   Physical Exam    Assessment & Plan:  There are no diagnoses linked to this encounter.  No follow-ups on file.   Zandra Abts, NP

## 2023-07-17 ENCOUNTER — Telehealth: Payer: Self-pay

## 2023-07-17 ENCOUNTER — Encounter: Payer: Self-pay | Admitting: Family Medicine

## 2023-07-17 ENCOUNTER — Ambulatory Visit (INDEPENDENT_AMBULATORY_CARE_PROVIDER_SITE_OTHER): Payer: 59 | Admitting: Family Medicine

## 2023-07-17 VITALS — BP 98/62 | HR 71 | Temp 98.6°F | Ht 63.0 in | Wt 105.5 lb

## 2023-07-17 DIAGNOSIS — Z13228 Encounter for screening for other metabolic disorders: Secondary | ICD-10-CM

## 2023-07-17 DIAGNOSIS — R110 Nausea: Secondary | ICD-10-CM | POA: Diagnosis not present

## 2023-07-17 DIAGNOSIS — F411 Generalized anxiety disorder: Secondary | ICD-10-CM

## 2023-07-17 DIAGNOSIS — Z1329 Encounter for screening for other suspected endocrine disorder: Secondary | ICD-10-CM | POA: Diagnosis not present

## 2023-07-17 DIAGNOSIS — Z130 Encounter for screening for diseases of the blood and blood-forming organs and certain disorders involving the immune mechanism: Secondary | ICD-10-CM | POA: Diagnosis not present

## 2023-07-17 DIAGNOSIS — Z8619 Personal history of other infectious and parasitic diseases: Secondary | ICD-10-CM | POA: Diagnosis not present

## 2023-07-17 DIAGNOSIS — Z7689 Persons encountering health services in other specified circumstances: Secondary | ICD-10-CM

## 2023-07-17 DIAGNOSIS — F332 Major depressive disorder, recurrent severe without psychotic features: Secondary | ICD-10-CM | POA: Diagnosis not present

## 2023-07-17 LAB — COMPREHENSIVE METABOLIC PANEL
ALT: 13 U/L (ref 0–35)
AST: 15 U/L (ref 0–37)
Albumin: 4.3 g/dL (ref 3.5–5.2)
Alkaline Phosphatase: 55 U/L (ref 39–117)
BUN: 11 mg/dL (ref 6–23)
CO2: 27 meq/L (ref 19–32)
Calcium: 9.4 mg/dL (ref 8.4–10.5)
Chloride: 106 meq/L (ref 96–112)
Creatinine, Ser: 0.87 mg/dL (ref 0.40–1.20)
GFR: 89.72 mL/min (ref >60.00)
Glucose, Bld: 92 mg/dL (ref 70–99)
Potassium: 4 meq/L (ref 3.5–5.1)
Sodium: 138 meq/L (ref 135–145)
Total Bilirubin: 0.5 mg/dL (ref 0.2–1.2)
Total Protein: 7 g/dL (ref 6.0–8.3)

## 2023-07-17 LAB — CBC WITH DIFFERENTIAL/PLATELET
Basophils Absolute: 0 10*3/uL (ref 0.0–0.1)
Basophils Relative: 0.7 % (ref 0.0–3.0)
Eosinophils Absolute: 0.2 10*3/uL (ref 0.0–0.7)
Eosinophils Relative: 3.4 % (ref 0.0–5.0)
HCT: 35.7 % — ABNORMAL LOW (ref 36.0–46.0)
Hemoglobin: 11.2 g/dL — ABNORMAL LOW (ref 12.0–15.0)
Lymphocytes Relative: 40.4 % (ref 12.0–46.0)
Lymphs Abs: 1.9 10*3/uL (ref 0.7–4.0)
MCHC: 31.5 g/dL (ref 30.0–36.0)
MCV: 86.5 fl (ref 78.0–100.0)
Monocytes Absolute: 0.5 10*3/uL (ref 0.1–1.0)
Monocytes Relative: 10.3 % (ref 3.0–12.0)
Neutro Abs: 2.1 10*3/uL (ref 1.4–7.7)
Neutrophils Relative %: 45.2 % (ref 43.0–77.0)
Platelets: 233 10*3/uL (ref 150.0–400.0)
RBC: 4.12 Mil/uL (ref 3.87–5.11)
RDW: 16.3 % — ABNORMAL HIGH (ref 11.5–15.5)
WBC: 4.6 10*3/uL (ref 4.0–10.5)

## 2023-07-17 LAB — TSH: TSH: 2.59 u[IU]/mL (ref 0.35–5.50)

## 2023-07-17 MED ORDER — ONDANSETRON 4 MG PO TBDP: 4.0000 mg | ORAL_TABLET | Freq: Three times a day (TID) | ORAL | 0 refills | Status: DC | PRN

## 2023-07-17 MED ORDER — VALACYCLOVIR HCL 500 MG PO TABS: 500.0000 mg | ORAL_TABLET | Freq: Two times a day (BID) | ORAL | 6 refills | Status: DC

## 2023-07-17 MED ORDER — FLUOXETINE HCL 20 MG PO CAPS: 20.0000 mg | ORAL_CAPSULE | Freq: Every day | ORAL | 0 refills | Status: DC

## 2023-07-17 NOTE — Assessment & Plan Note (Signed)
Uncontrolled. Restarting Fluoxetine 20mg  daily. Recommend to start taking medication at night time due to help relieve side effects, such as nausea or vomiting. Advised if she develops suicidal or homicidal symptoms, stop medication immediately, and go to the closes emergency department. Follow up in 1 month to assess how she is doing with restarting medication. Also, placed a referral to psychology for grief counseling since her best friend passed away earlier this year.

## 2023-07-17 NOTE — Telephone Encounter (Signed)
-----   Message from Zandra Abts sent at 07/17/2023  1:17 PM EDT ----- Labs are stable. No concern.

## 2023-07-17 NOTE — Assessment & Plan Note (Signed)
Reports recent outbreak. Refilled Valacyclovir 500mg  BID.

## 2023-07-17 NOTE — Assessment & Plan Note (Signed)
Restarting Fluoxetine 20mg  daily.

## 2023-07-17 NOTE — Patient Instructions (Addendum)
-  Restarting Fluoxetine 20mg  daily. Recommend to start taking medication at night time due to help relieve side effects, such as nausea or vomiting. Advise if you develop suicidal or homicidal symptoms, stop medication immediately, and go to the closes emergency department. Follow up in 1 month to assess how you are doing with restarting medication. Also, placed a referral to psychology for grief counseling since your best friend passed away earlier this year. If you do not receive a phone call or receive a MyChart message in 2 weeks, please call back to the office. -Refilled Zofran for intermittent nausea. Recommend to follow up with GI.  -Refilled Valacyclovir 500mg , 1 tablet twice a day for genital herpes.  -Ordered screening labs. Office will call with results and you will see them on MyChart.  -Requesting records from [facility] concerning managing ADHD with Vyvanse.

## 2023-07-18 ENCOUNTER — Telehealth: Payer: Self-pay | Admitting: Family Medicine

## 2023-07-18 NOTE — Telephone Encounter (Signed)
[facility] Medical Records Dept called and they can't read the medical release form that was sent over to them. She called to get clarity so it wouldn't happen by fax again.   She said to simply call the dept [phone] and discuss the form.

## 2023-07-18 NOTE — Telephone Encounter (Signed)
Clarified all records . They will send these today.

## 2023-07-22 ENCOUNTER — Encounter (HOSPITAL_BASED_OUTPATIENT_CLINIC_OR_DEPARTMENT_OTHER): Payer: Self-pay | Admitting: Emergency Medicine

## 2023-07-22 ENCOUNTER — Emergency Department (HOSPITAL_BASED_OUTPATIENT_CLINIC_OR_DEPARTMENT_OTHER)
Admission: EM | Admit: 2023-07-22 | Discharge: 2023-07-22 | Disposition: A | Discharge status: Home / Self Care | Payer: 59 | Attending: Emergency Medicine | Admitting: Emergency Medicine

## 2023-07-22 DIAGNOSIS — D72829 Elevated white blood cell count, unspecified: Secondary | ICD-10-CM | POA: Insufficient documentation

## 2023-07-22 DIAGNOSIS — R9431 Abnormal electrocardiogram [ECG] [EKG]: Secondary | ICD-10-CM | POA: Diagnosis not present

## 2023-07-22 DIAGNOSIS — R197 Diarrhea, unspecified: Secondary | ICD-10-CM | POA: Insufficient documentation

## 2023-07-22 DIAGNOSIS — R112 Nausea with vomiting, unspecified: Secondary | ICD-10-CM | POA: Diagnosis not present

## 2023-07-22 LAB — COMPREHENSIVE METABOLIC PANEL
ALT: 10 U/L (ref 0–44)
AST: 14 U/L — ABNORMAL LOW (ref 15–41)
Albumin: 4.8 g/dL (ref 3.5–5.0)
Alkaline Phosphatase: 57 U/L (ref 38–126)
Anion gap: 11 (ref 5–15)
BUN: 10 mg/dL (ref 6–20)
CO2: 23 mmol/L (ref 22–32)
Calcium: 9.7 mg/dL (ref 8.9–10.3)
Chloride: 106 mmol/L (ref 98–111)
Creatinine, Ser: 0.86 mg/dL (ref 0.44–1.00)
GFR, Estimated: >60 mL/min (ref >60)
Glucose, Bld: 140 mg/dL — ABNORMAL HIGH (ref 70–99)
Potassium: 3.5 mmol/L (ref 3.5–5.1)
Sodium: 140 mmol/L (ref 135–145)
Total Bilirubin: 0.5 mg/dL (ref 0.3–1.2)
Total Protein: 7.8 g/dL (ref 6.5–8.1)

## 2023-07-22 LAB — CBC WITH DIFFERENTIAL/PLATELET
Abs Immature Granulocytes: 0.09 10*3/uL — ABNORMAL HIGH (ref 0.00–0.07)
Basophils Absolute: 0.1 10*3/uL (ref 0.0–0.1)
Basophils Relative: 0 %
Eosinophils Absolute: 0 10*3/uL (ref 0.0–0.5)
Eosinophils Relative: 0 %
HCT: 34.2 % — ABNORMAL LOW (ref 36.0–46.0)
Hemoglobin: 11.8 g/dL — ABNORMAL LOW (ref 12.0–15.0)
Immature Granulocytes: 0 %
Lymphocytes Relative: 11 %
Lymphs Abs: 2.3 10*3/uL (ref 0.7–4.0)
MCH: 28.6 pg (ref 26.0–34.0)
MCHC: 34.5 g/dL (ref 30.0–36.0)
MCV: 82.8 fL (ref 80.0–100.0)
Monocytes Absolute: 1.7 10*3/uL — ABNORMAL HIGH (ref 0.1–1.0)
Monocytes Relative: 8 %
Neutro Abs: 17 10*3/uL — ABNORMAL HIGH (ref 1.7–7.7)
Neutrophils Relative %: 81 %
Platelets: 285 10*3/uL (ref 150–400)
RBC: 4.13 MIL/uL (ref 3.87–5.11)
RDW: 15.2 % (ref 11.5–15.5)
WBC: 21.2 10*3/uL — ABNORMAL HIGH (ref 4.0–10.5)
nRBC: 0 % (ref 0.0–0.2)

## 2023-07-22 LAB — LIPASE, BLOOD: Lipase: <10 U/L — ABNORMAL LOW (ref 11–51)

## 2023-07-22 LAB — URINALYSIS, MICROSCOPIC (REFLEX)

## 2023-07-22 LAB — URINALYSIS, ROUTINE W REFLEX MICROSCOPIC
Bilirubin Urine: NEGATIVE
Glucose, UA: NEGATIVE mg/dL
Ketones, ur: 15 mg/dL — AB
Nitrite: NEGATIVE
Protein, ur: 30 mg/dL — AB
Specific Gravity, Urine: 1.02 (ref 1.005–1.030)
pH: 8.5 — ABNORMAL HIGH (ref 5.0–8.0)

## 2023-07-22 LAB — PREGNANCY, URINE: Preg Test, Ur: NEGATIVE

## 2023-07-22 MED ORDER — METOCLOPRAMIDE HCL 5 MG/ML IJ SOLN
10.0000 mg | Freq: Once | INTRAMUSCULAR | Status: AC
  Administered 2023-07-22: 10 mg via INTRAVENOUS
  Filled 2023-07-22: qty 2

## 2023-07-22 MED ORDER — METOCLOPRAMIDE HCL 10 MG PO TABS: 10.0000 mg | ORAL_TABLET | Freq: Four times a day (QID) | ORAL | 0 refills | Status: DC | PRN

## 2023-07-22 MED ORDER — DIPHENHYDRAMINE HCL 50 MG/ML IJ SOLN
12.5000 mg | Freq: Once | INTRAMUSCULAR | Status: AC
  Administered 2023-07-22: 12.5 mg via INTRAVENOUS
  Filled 2023-07-22: qty 1

## 2023-07-22 MED ORDER — LACTATED RINGERS IV BOLUS
1000.0000 mL | Freq: Once | INTRAVENOUS | Status: AC
  Administered 2023-07-22: 1000 mL via INTRAVENOUS

## 2023-07-22 MED ORDER — HALOPERIDOL LACTATE 5 MG/ML IJ SOLN
4.0000 mg | Freq: Once | INTRAMUSCULAR | Status: AC
  Administered 2023-07-22: 4 mg via INTRAVENOUS
  Filled 2023-07-22: qty 1

## 2023-07-22 NOTE — ED Notes (Signed)
Unable to obtain temperature at this time, pt vomiting

## 2023-07-22 NOTE — ED Triage Notes (Signed)
Pt c/o n/v/d since 0430 today. Last marijuana use x 3 days pta

## 2023-07-22 NOTE — ED Notes (Signed)
Dc instructions reviewed with patient. Patient voiced understanding. Dc with belongings.  °

## 2023-07-22 NOTE — ED Notes (Signed)
Pt extremely cool and clammy, oral and axillary temp does not register, temporal 95.1 ,correlates with clinical presentation

## 2023-07-22 NOTE — ED Provider Notes (Signed)
[place] EMERGENCY DEPARTMENT AT [facility] Provider Note   CSN: [id number] Arrival date & time: 07/22/23  0710     History {Add pertinent medical, surgical, social history, OB history to HPI:1} No chief complaint on file.   Denise [name] is a 32 y.o. female.  HPI      31yo female with history of ADHD, depression, migraines, anxiety, intermittent nausea/vomiting, had concern for possible acalculous cholecystitis admission in January found to have negative HIDA scan and referred to GI, who presents with concern for nausea and vomiting.   Woke up this AM with nausea, vomiting, kept going and couldn't stop Knew nothing else to do at home to help Threw up 12 times No blood Diarrhea x1, no black or bloody stools No fevers No abdominal pain No cough, cp, sob or headache No recent abx Just started back on depression medicine this week 20mg  prozac Tried nausea medicine at home but still vomiting after No urinary symptoms, vaginal discharge She has not stopped any medications. No smoking, etoh. Cut back on marijuana but did use 3 days ago   Past Medical History:  Diagnosis Date   ADHD (attention deficit hyperactivity disorder)    Anxiety    Depression    Genital herpes    Migraines      Past Surgical History:  Procedure Laterality Date   NO PAST SURGERIES      Home Medications Prior to Admission medications   Medication Sig Start Date End Date Taking? Authorizing Provider  FLUoxetine (PROZAC) 20 MG capsule Take 1 capsule (20 mg total) by mouth daily. 07/17/23   Alveria Apley, NP  ondansetron (ZOFRAN-ODT) 4 MG disintegrating tablet Take 1 tablet (4 mg total) by mouth every 8 (eight) hours as needed for nausea or vomiting. 07/17/23   Alveria Apley, NP  Ubrogepant (UBRELVY) 100 MG TABS Take 100 mg by mouth every 2 (two) hours as needed. Maximum 200mg  a day. 06/04/22   Anson Fret, MD  valACYclovir (VALTREX) 500 MG tablet Take 1 tablet (500 mg  total) by mouth 2 (two) times daily. 07/17/23   Alveria Apley, NP      Allergies    Cephalosporins    Review of Systems   Review of Systems  Physical Exam Updated Vital Signs There were no vitals taken for this visit. Physical Exam  ED Results / Procedures / Treatments   Labs (all labs ordered are listed, but only abnormal results are displayed) Labs Reviewed - No data to display  EKG None  Radiology No results found.  Procedures Procedures  {Document cardiac monitor, telemetry assessment procedure when appropriate:1}  Medications Ordered in ED Medications - No data to display  ED Course/ Medical Decision Making/ A&P   {   Click here for ABCD2, HEART and other calculatorsREFRESH Note before signing :1}                                31yo female with history of ADHD, depression, migraines, anxiety, intermittent nausea/vomiting, had concern for possible acalculous cholecystitis admission in January found to have negative HIDA scan and referred to GI, who presents with concern for nausea and vomiting.   Labs are completed and personally about interpreted by me are significant for a white count of 21,000, mild anemia, normal transaminases, no significant electrolyte abnormalities, no sign of pancreatitis.  EKG was completed in the setting of giving QT prolonging medications and  shows no acute abnormalities.  She has no abdominal pain or tenderness, and while she does have a leukocytosis, her history and exam are not consistent with a cholecystitis, appendicitis, or other acute intra-abdominal abscess or surgical process.  Consider C. difficile as a cause of her leukocytosis, however she is only had 1 episode of diarrhea, and has had more significant and severe vomiting and this seems less likely.  She also has not been on any recent antibiotics.  Possible her leukocytosis secondary to other inflammatory process/denies IV drug use/denies other symptoms to suggest other  serious bacterial infection.  Suspect her nausea and vomiting given her history of similar is secondary to a gastroparesis, other cyclic vomiting, cannabinoid hyperemesis.  She was given initially Reglan and Benadryl with fluids with some improvement, however had return of nausea and was given Haldol for nausea.  {Document critical care time when appropriate:1} {Document review of labs and clinical decision tools ie heart score, Chads2Vasc2 etc:1}  {Document your independent review of radiology images, and any outside records:1} {Document your discussion with family members, caretakers, and with consultants:1} {Document social determinants of health affecting pt's care:1} {Document your decision making why or why not admission, treatments were needed:1} Final Clinical Impression(s) / ED Diagnoses Final diagnoses:  None    Rx / DC Orders ED Discharge Orders     None

## 2023-07-24 ENCOUNTER — Telehealth (HOSPITAL_COMMUNITY): Payer: Self-pay | Admitting: Emergency Medicine

## 2023-07-24 MED ORDER — SULFAMETHOXAZOLE-TRIMETHOPRIM 800-160 MG PO TABS: 1.0000 | ORAL_TABLET | Freq: Two times a day (BID) | ORAL | 0 refills | Status: AC

## 2023-07-24 NOTE — Telephone Encounter (Signed)
Bactrim sent to pharmacy

## 2023-08-16 ENCOUNTER — Ambulatory Visit (INDEPENDENT_AMBULATORY_CARE_PROVIDER_SITE_OTHER): Payer: 59 | Admitting: Family Medicine

## 2023-08-16 VITALS — BP 114/68 | HR 58 | Temp 98.2°F | Wt 105.4 lb

## 2023-08-16 DIAGNOSIS — F321 Major depressive disorder, single episode, moderate: Secondary | ICD-10-CM

## 2023-08-16 DIAGNOSIS — Z09 Encounter for follow-up examination after completed treatment for conditions other than malignant neoplasm: Secondary | ICD-10-CM

## 2023-08-16 DIAGNOSIS — F411 Generalized anxiety disorder: Secondary | ICD-10-CM | POA: Diagnosis not present

## 2023-08-16 NOTE — Assessment & Plan Note (Signed)
Stable. Continue Fluoxetine 20 mg daily.

## 2023-08-16 NOTE — Assessment & Plan Note (Signed)
Stable. Continue with Fluoxetine 20mg  daily.

## 2023-08-16 NOTE — Progress Notes (Signed)
   Established Patient Office Visit   Subjective:  Patient ID: Denise [name], female    DOB: [birth year]  Age: 32 y.o. MRN: [id number]  Chief Complaint  Patient presents with   Medical Management of Chronic Issues    Can tell a difference. Does not feel so blah, mornings are getting better.     HPI Depression: Chronic. On previous appointment, 09/17, patient was restarted on Fluoxetine 20 mg daily. She reports her emotional health has improved with restarting medication. She feels stable with current dose, taking it at night time. Denies any complications with restarting medication. Patient has an appointment with [facility], initial visit next Tuesday from a referral placed at last visit.   Denies any other concerns.  ROS See HPI above     Objective:   BP 114/68 (BP Location: Left Arm, Patient Position: Sitting, Cuff Size: Normal)   Pulse (!) 58   Temp 98.2 F (36.8 C) (Oral)   Wt 105 lb 6.4 oz (47.8 kg)   LMP 07/08/2023   SpO2 99%   BMI 18.67 kg/m    Physical Exam Vitals reviewed.  Constitutional:      General: She is not in acute distress.    Appearance: Normal appearance. She is not ill-appearing, toxic-appearing or diaphoretic.  HENT:     Head: Normocephalic and atraumatic.  Eyes:     General:        Right eye: No discharge.        Left eye: No discharge.     Conjunctiva/sclera: Conjunctivae normal.  Cardiovascular:     Rate and Rhythm: Normal rate and regular rhythm.     Heart sounds: Normal heart sounds. No murmur heard.    No friction rub. No gallop.  Pulmonary:     Effort: Pulmonary effort is normal. No respiratory distress.     Breath sounds: Normal breath sounds.  Musculoskeletal:        General: Normal range of motion.  Skin:    General: Skin is warm and dry.  Neurological:     General: No focal deficit present.     Mental Status: She is alert and oriented to person, place, and time. Mental status is at baseline.  Psychiatric:         Attention and Perception: Attention normal.        Mood and Affect: Mood normal.        Speech: Speech normal.        Behavior: Behavior normal. Behavior is cooperative.        Thought Content: Thought content normal.        Cognition and Memory: Cognition normal.        Judgment: Judgment normal.      Assessment & Plan:  Current moderate episode of major depressive disorder, unspecified whether recurrent (HCC) Assessment & Plan: Stable. Continue with Fluoxetine 20mg  daily.    GAD (generalized anxiety disorder) Assessment & Plan: Stable. Continue Fluoxetine 20mg  daily.    Follow-up exam  1.Encourage to keep the appointment with [facility] as scheduled next Tuesday.  2.Follow up sooner if emotional health become unstable or symptoms become worse.  3.Review health maintenance: -Will request records from [facility] for a copy of last cervical cancer screening. -Covid booster: Previously declined booster.  Return in about 6 months (around 02/14/2024) for chronic management.   Zandra Abts, NP

## 2023-08-16 NOTE — Patient Instructions (Addendum)
-  I am glad you are feeling better.  -Encourage to keep the appointment with [facility] as scheduled next Tuesday.  -Continue taking Fluoxetine 20mg  daily. When you need a refill, please call the office/pharmacy, or send a MyChart message.  -Follow up in 6 months or sooner if emotional health becomes unstable or becomes worse.

## 2023-10-16 DIAGNOSIS — N911 Secondary amenorrhea: Secondary | ICD-10-CM | POA: Diagnosis not present

## 2023-10-31 NOTE — L&D Delivery Note (Signed)
 Delivery Note At 9:07 PM a viable female was delivered via Vaginal, Spontaneous (Presentation: Left Occiput Anterior).  APGAR: 9, 9; weight  .   Placenta status: Spontaneous, Intact.  Cord: 3 vessels with the following complications: None  Anesthesia: Epidural Episiotomy: None Lacerations: 1st degree Suture Repair: 2.0 vicryl rapide Est. Blood Loss (mL):  50 cc  Mom to postpartum.  Baby to Couplet care / Skin to Skin.  Denise [name] 05/18/2024, 9:29 PM

## 2023-11-06 DIAGNOSIS — Z3481 Encounter for supervision of other normal pregnancy, first trimester: Secondary | ICD-10-CM | POA: Diagnosis not present

## 2023-11-06 DIAGNOSIS — Z3685 Encounter for antenatal screening for Streptococcus B: Secondary | ICD-10-CM | POA: Diagnosis not present

## 2023-11-06 LAB — OB RESULTS CONSOLE HIV ANTIBODY (ROUTINE TESTING): HIV: NONREACTIVE

## 2023-11-06 LAB — OB RESULTS CONSOLE RPR: RPR: NONREACTIVE

## 2023-11-06 LAB — OB RESULTS CONSOLE HEPATITIS B SURFACE ANTIGEN: Hepatitis B Surface Ag: NEGATIVE

## 2023-11-06 LAB — OB RESULTS CONSOLE RUBELLA ANTIBODY, IGM: Rubella: IMMUNE

## 2023-11-15 DIAGNOSIS — Z124 Encounter for screening for malignant neoplasm of cervix: Secondary | ICD-10-CM | POA: Diagnosis not present

## 2023-11-16 LAB — HM PAP SMEAR

## 2023-11-23 ENCOUNTER — Other Ambulatory Visit: Payer: Self-pay

## 2023-11-23 ENCOUNTER — Emergency Department (HOSPITAL_BASED_OUTPATIENT_CLINIC_OR_DEPARTMENT_OTHER)
Admission: EM | Admit: 2023-11-23 | Discharge: 2023-11-23 | Disposition: A | Discharge status: Home / Self Care | Payer: 59 | Attending: Emergency Medicine | Admitting: Emergency Medicine

## 2023-11-23 ENCOUNTER — Encounter (HOSPITAL_BASED_OUTPATIENT_CLINIC_OR_DEPARTMENT_OTHER): Payer: Self-pay | Admitting: Emergency Medicine

## 2023-11-23 DIAGNOSIS — Z1152 Encounter for screening for COVID-19: Secondary | ICD-10-CM | POA: Insufficient documentation

## 2023-11-23 DIAGNOSIS — Z3A14 14 weeks gestation of pregnancy: Secondary | ICD-10-CM | POA: Diagnosis not present

## 2023-11-23 DIAGNOSIS — O2942 Spinal and epidural anesthesia induced headache during pregnancy, second trimester: Secondary | ICD-10-CM | POA: Insufficient documentation

## 2023-11-23 DIAGNOSIS — J101 Influenza due to other identified influenza virus with other respiratory manifestations: Secondary | ICD-10-CM | POA: Insufficient documentation

## 2023-11-23 DIAGNOSIS — O99512 Diseases of the respiratory system complicating pregnancy, second trimester: Secondary | ICD-10-CM | POA: Diagnosis not present

## 2023-11-23 DIAGNOSIS — O98512 Other viral diseases complicating pregnancy, second trimester: Secondary | ICD-10-CM | POA: Diagnosis not present

## 2023-11-23 LAB — RESP PANEL BY RT-PCR (RSV, FLU A&B, COVID)  RVPGX2
Influenza A by PCR: POSITIVE — AB
Influenza B by PCR: NEGATIVE
Resp Syncytial Virus by PCR: NEGATIVE
SARS Coronavirus 2 by RT PCR: NEGATIVE

## 2023-11-23 LAB — GROUP A STREP BY PCR: Group A Strep by PCR: NOT DETECTED

## 2023-11-23 MED ORDER — ACETAMINOPHEN 325 MG PO TABS
650.0000 mg | ORAL_TABLET | Freq: Once | ORAL | Status: AC
  Administered 2023-11-23: 650 mg via ORAL
  Filled 2023-11-23: qty 2

## 2023-11-23 MED ORDER — OSELTAMIVIR PHOSPHATE 75 MG PO CAPS: 75.0000 mg | ORAL_CAPSULE | Freq: Two times a day (BID) | ORAL | 0 refills | Status: DC

## 2023-11-23 NOTE — ED Notes (Signed)

## 2023-11-23 NOTE — Discharge Instructions (Addendum)
Take the Tamiflu medication as prescribed.  You can also take over-the-counter Tylenol for aches and pains.  Follow-up with your primary care or OB/GYN doctor to be rechecked

## 2023-11-23 NOTE — ED Triage Notes (Signed)
Pt endorses cough with HA body aches sore throat and chills x 2 days. Endorses [redacted] weeks pregnant

## 2023-11-23 NOTE — ED Provider Notes (Signed)
[place] EMERGENCY DEPARTMENT AT [facility] Provider Note   CSN: [id number] Arrival date & time: 11/23/23  1308     History  Chief Complaint  Patient presents with   Headache    Denise [name] is a 33 y.o. female.   Headache    Patient is currently [redacted] weeks pregnant.  She started having symptoms a couple days ago of headaches body aches sore throat and chills.  She has not measured any fevers at home.  Patient does have history of anxiety depression and migraines.  Patient denies any dysuria.  No abdominal pain she has had a slight cough but nothing significant.  No shortness of breath.  Home Medications Prior to Admission medications   Medication Sig Start Date End Date Taking? Authorizing Provider  oseltamivir (TAMIFLU) 75 MG capsule Take 1 capsule (75 mg total) by mouth 2 (two) times daily. 11/23/23  Yes Linwood Dibbles, MD  FLUoxetine (PROZAC) 20 MG capsule Take 1 capsule (20 mg total) by mouth daily. 07/17/23   Alveria Apley, NP  metoCLOPramide (REGLAN) 10 MG tablet Take 1 tablet (10 mg total) by mouth every 6 (six) hours as needed for nausea. 07/22/23   Alvira Monday, MD  ondansetron (ZOFRAN-ODT) 4 MG disintegrating tablet Take 1 tablet (4 mg total) by mouth every 8 (eight) hours as needed for nausea or vomiting. 07/17/23   Alveria Apley, NP  Ubrogepant (UBRELVY) 100 MG TABS Take 100 mg by mouth every 2 (two) hours as needed. Maximum 200mg  a day. 06/04/22   Anson Fret, MD  valACYclovir (VALTREX) 500 MG tablet Take 1 tablet (500 mg total) by mouth 2 (two) times daily. 07/17/23   Alveria Apley, NP      Allergies    Cephalosporins    Review of Systems   Review of Systems  Neurological:  Positive for headaches.    Physical Exam Updated Vital Signs BP 116/69 (BP Location: Right Arm)   Pulse 98   Temp 98.3 F (36.8 C)   Resp 18   Wt 50.3 kg   LMP 08/15/2023   SpO2 100%   BMI 19.66 kg/m  Physical Exam Vitals and nursing note  reviewed.  Constitutional:      General: She is not in acute distress.    Appearance: She is well-developed.  HENT:     Head: Normocephalic and atraumatic.     Jaw: No trismus.     Right Ear: External ear normal.     Left Ear: External ear normal.     Mouth/Throat:     Pharynx: No pharyngeal swelling, oropharyngeal exudate, posterior oropharyngeal erythema or uvula swelling.  Eyes:     General: No scleral icterus.       Right eye: No discharge.        Left eye: No discharge.     Conjunctiva/sclera: Conjunctivae normal.  Neck:     Trachea: No tracheal deviation.  Cardiovascular:     Rate and Rhythm: Normal rate and regular rhythm.  Pulmonary:     Effort: Pulmonary effort is normal. No respiratory distress.     Breath sounds: Normal breath sounds. No stridor. No wheezing or rales.  Abdominal:     General: Bowel sounds are normal. There is no distension.     Palpations: Abdomen is soft.     Tenderness: There is no abdominal tenderness. There is no guarding or rebound.  Musculoskeletal:        General: No tenderness or deformity.  Cervical back: Normal range of motion and neck supple.  Skin:    General: Skin is warm and dry.     Findings: No rash.  Neurological:     General: No focal deficit present.     Mental Status: She is alert.     Cranial Nerves: No cranial nerve deficit, dysarthria or facial asymmetry.     Sensory: No sensory deficit.     Motor: No abnormal muscle tone or seizure activity.     Coordination: Coordination normal.  Psychiatric:        Mood and Affect: Mood normal.     ED Results / Procedures / Treatments   Labs (all labs ordered are listed, but only abnormal results are displayed) Labs Reviewed  RESP PANEL BY RT-PCR (RSV, FLU A&B, COVID)  RVPGX2 - Abnormal; Notable for the following components:      Result Value   Influenza A by PCR POSITIVE (*)    All other components within normal limits  GROUP A STREP BY PCR    EKG None  Radiology No  results found.  Procedures Procedures    Medications Ordered in ED Medications  acetaminophen (TYLENOL) tablet 650 mg (650 mg Oral Given 11/23/23 1014)    ED Course/ Medical Decision Making/ A&P Clinical Course as of 11/23/23 1111  Fri Nov 23, 2023  1018 Influenza test is positive [JK]  1027 [place] OB/[facility]N literature reviewed.  Tamiflu is recommended and safe in pregnancy.  Discussed with patient.  Will prescribe Tamiflu for her influenza illness. [JK]  1103 Strep test negative [JK]    Clinical Course User Index [JK] Linwood Dibbles, MD                                 Medical Decision Making Problems Addressed: Influenza A: acute illness or injury  Amount and/or Complexity of Data Reviewed Labs: ordered. Decision-making details documented in ED Course.  Risk OTC drugs. Prescription drug management.   Patient presented to the ED with complaints of cough body aches sore throat.  Patient is currently [redacted] weeks pregnant but does not have any complaints of abdominal pain vaginal bleeding.  Patient's ED testing is notable for influenza A.  She is negative for strep throat.  Medical literature reviewed.  OB/GYN clinics from 2023, An overview of antiviral treatments in Pregnancy, recommends tamiflu treatment.  Will rx tamiflu.  Outpatient follow up with OB GYN        Final Clinical Impression(s) / ED Diagnoses Final diagnoses:  Influenza A    Rx / DC Orders ED Discharge Orders          Ordered    oseltamivir (TAMIFLU) 75 MG capsule  2 times daily        11/23/23 1028              Linwood Dibbles, MD 11/23/23 1114

## 2023-11-29 ENCOUNTER — Telehealth: Payer: Self-pay

## 2023-11-29 NOTE — Progress Notes (Signed)
Transition Care Management Unsuccessful Follow-up Telephone Call  Date of discharge and from where:  11/23/2023 [facility]  Attempts:  1st Attempt  Reason for unsuccessful TCM follow-up call:  No answer/busy  Ulyana Pitones [facility]  [facility] Guide Direct Dial: [phone]  Fax: [phone] Website: [email or web address]

## 2023-12-03 ENCOUNTER — Telehealth: Payer: Self-pay

## 2023-12-03 NOTE — Progress Notes (Signed)
Transition Care Management Unsuccessful Follow-up Telephone Call  Date of discharge and from where:  11/23/2023   Attempts:  2nd Attempt  Reason for unsuccessful TCM follow-up call:  No answer/busy  Laverne Klugh [facility]  [facility] Guide Direct Dial: [phone]  Fax: [phone] Website: [email or web address]

## 2023-12-20 DIAGNOSIS — Z3A17 17 weeks gestation of pregnancy: Secondary | ICD-10-CM | POA: Diagnosis not present

## 2023-12-20 DIAGNOSIS — Z361 Encounter for antenatal screening for raised alphafetoprotein level: Secondary | ICD-10-CM | POA: Diagnosis not present

## 2023-12-20 DIAGNOSIS — N760 Acute vaginitis: Secondary | ICD-10-CM | POA: Diagnosis not present

## 2024-01-08 DIAGNOSIS — Z3A20 20 weeks gestation of pregnancy: Secondary | ICD-10-CM | POA: Diagnosis not present

## 2024-01-08 DIAGNOSIS — Z363 Encounter for antenatal screening for malformations: Secondary | ICD-10-CM | POA: Diagnosis not present

## 2024-02-05 DIAGNOSIS — N760 Acute vaginitis: Secondary | ICD-10-CM | POA: Diagnosis not present

## 2024-02-05 DIAGNOSIS — Z3A24 24 weeks gestation of pregnancy: Secondary | ICD-10-CM | POA: Diagnosis not present

## 2024-02-14 ENCOUNTER — Ambulatory Visit (INDEPENDENT_AMBULATORY_CARE_PROVIDER_SITE_OTHER): Payer: 59 | Admitting: Family Medicine

## 2024-02-14 ENCOUNTER — Encounter: Payer: Self-pay | Admitting: Family Medicine

## 2024-02-14 VITALS — BP 102/60 | HR 82 | Temp 98.1°F | Ht 63.0 in | Wt 120.0 lb

## 2024-02-14 DIAGNOSIS — Z8619 Personal history of other infectious and parasitic diseases: Secondary | ICD-10-CM

## 2024-02-14 DIAGNOSIS — F411 Generalized anxiety disorder: Secondary | ICD-10-CM | POA: Diagnosis not present

## 2024-02-14 DIAGNOSIS — F321 Major depressive disorder, single episode, moderate: Secondary | ICD-10-CM

## 2024-02-14 MED ORDER — VALACYCLOVIR HCL 500 MG PO TABS
500.0000 mg | ORAL_TABLET | Freq: Two times a day (BID) | ORAL | 6 refills | Status: DC
Start: 2024-02-14 — End: 2024-05-20

## 2024-02-14 NOTE — Patient Instructions (Addendum)
-  It was great to see you today and congratulations once again on your pregnancy with baby girl!  -Dicussed about restarting Fluoxetine for depression. Through shared decision making, decided to not take medication. Concerns with how much harm it could cause on baby girl. Recommend to follow up about 6 weeks from your due date to see how your depression is going with having a new baby. In meantime, if your mental health changes and emotions become overwhelming, please reach back out.  -Refilled Valacyclovir. -Happy Saverio Curling!

## 2024-02-14 NOTE — Progress Notes (Signed)
 Established Patient Office Visit   Subjective:  Patient ID: Denise [name], female    DOB: [birth year]  Age: 33 y.o. MRN: [id number]  Chief Complaint  Patient presents with   Medical Management of Chronic Issues   Anxiety   Depression    HPI:  Depression: Chronic.Patient is following up on depression. Last seen in office on 08/16/2023. Since, last visit she is 25 weeks, 6 day pregnant with a girl. At last visit she was taking Fluoxetine 20mg  daily. She reports she has been off medication for about 5 months ago. She reports she stopped taking medication shortly after finding out she was pregnant with concerns of the affects on the baby. She reports she has spoke with Dr. Henderson Cloud, GYN at [facility], who advised her  that she could take medication during pregnancy. Her plan was to restart medication at this appointment with concerns of experiencing post partum depression. Right now, she reports her mental health and emotions are stable.   Genital Herpes: Chronic. Patient is prescribed Valacyclovir 500mg  BID. She is not taking every day, but will start taking it closer to delivery every day.    Review of Systems  Psychiatric/Behavioral:  Positive for depression.   See HPI above     Objective:   0BP 102/60   Pulse 82   Temp 98.1 F (36.7 C) (Oral)   Ht 5\' 3"  (1.6 m)   Wt 120 lb (54.4 kg)   LMP 08/15/2023   SpO2 99%   BMI 21.26 kg/m    Physical Exam Vitals reviewed.  Constitutional:      General: She is not in acute distress.    Appearance: Normal appearance. She is not ill-appearing, toxic-appearing or diaphoretic.  HENT:     Head: Normocephalic and atraumatic.  Eyes:     General:        Right eye: No discharge.        Left eye: No discharge.     Conjunctiva/sclera: Conjunctivae normal.  Cardiovascular:     Rate and Rhythm: Normal rate and regular rhythm.     Heart sounds: Normal heart sounds. No murmur heard.    No friction rub. No gallop.  Pulmonary:      Effort: Pulmonary effort is normal. No respiratory distress.     Breath sounds: Normal breath sounds.  Musculoskeletal:        General: Normal range of motion.  Skin:    General: Skin is warm and dry.  Neurological:     General: No focal deficit present.     Mental Status: She is alert and oriented to person, place, and time. Mental status is at baseline.  Psychiatric:        Mood and Affect: Mood normal.        Behavior: Behavior normal.        Thought Content: Thought content normal.        Judgment: Judgment normal.      Assessment & Plan:  Current moderate episode of major depressive disorder, unspecified whether recurrent (HCC)  GAD (generalized anxiety disorder)  History of herpes genitalis -     valACYclovir HCl; Take 1 tablet (500 mg total) by mouth 2 (two) times daily.  Dispense: 60 tablet; Refill: 6  -Dicussed about restarting Fluoxetine for depression. Through shared decision making, decided to not take medication. Concerns with how much harm it could cause on baby girl, even though GYN told patient that it would be fine to take during 3rd trimester.  Patient scored 16 on GAD-7 screening and 3 on PHQ-9. She reports she feels like her depression and anxiety are managed without medication. Recommend to follow up about 6 weeks from her due date to see how her depression is going with having a new baby. She reports she does not plan to breastfeed and would be able to restart Fluoxetine. In meantime, if her mental health changes and emotions become overwhelming, advised to reach back out.  -Refilled Valacyclovir for genital herpes.   Return in about 4 months (around 06/15/2024).   Wateen Varon, NP

## 2024-02-28 DIAGNOSIS — Z3A27 27 weeks gestation of pregnancy: Secondary | ICD-10-CM | POA: Diagnosis not present

## 2024-02-28 DIAGNOSIS — Z3480 Encounter for supervision of other normal pregnancy, unspecified trimester: Secondary | ICD-10-CM | POA: Diagnosis not present

## 2024-02-28 DIAGNOSIS — Z23 Encounter for immunization: Secondary | ICD-10-CM | POA: Diagnosis not present

## 2024-04-30 DIAGNOSIS — Z369 Encounter for antenatal screening, unspecified: Secondary | ICD-10-CM | POA: Diagnosis not present

## 2024-04-30 LAB — OB RESULTS CONSOLE GBS: GBS: NEGATIVE

## 2024-05-04 ENCOUNTER — Encounter (HOSPITAL_COMMUNITY): Payer: Self-pay | Admitting: Obstetrics and Gynecology

## 2024-05-04 ENCOUNTER — Inpatient Hospital Stay (HOSPITAL_COMMUNITY)
Admission: AD | Admit: 2024-05-04 | Discharge: 2024-05-04 | Disposition: A | Discharge status: Home / Self Care | Attending: Obstetrics and Gynecology | Admitting: Obstetrics and Gynecology

## 2024-05-04 DIAGNOSIS — O23593 Infection of other part of genital tract in pregnancy, third trimester: Secondary | ICD-10-CM | POA: Diagnosis present

## 2024-05-04 DIAGNOSIS — O479 False labor, unspecified: Secondary | ICD-10-CM

## 2024-05-04 DIAGNOSIS — Z3689 Encounter for other specified antenatal screening: Secondary | ICD-10-CM

## 2024-05-04 DIAGNOSIS — Z0371 Encounter for suspected problem with amniotic cavity and membrane ruled out: Secondary | ICD-10-CM | POA: Diagnosis not present

## 2024-05-04 DIAGNOSIS — Z3A37 37 weeks gestation of pregnancy: Secondary | ICD-10-CM

## 2024-05-04 DIAGNOSIS — B3731 Acute candidiasis of vulva and vagina: Secondary | ICD-10-CM | POA: Insufficient documentation

## 2024-05-04 LAB — RUPTURE OF MEMBRANE (ROM)PLUS: Rom Plus: NEGATIVE

## 2024-05-04 LAB — WET PREP, GENITAL
Clue Cells Wet Prep HPF POC: NONE SEEN
Sperm: NONE SEEN
Trich, Wet Prep: NONE SEEN
WBC, Wet Prep HPF POC: >=10 — AB (ref <10)

## 2024-05-04 LAB — POCT FERN TEST: POCT Fern Test: NEGATIVE

## 2024-05-04 MED ORDER — FLUCONAZOLE 150 MG PO TABS
150.0000 mg | ORAL_TABLET | ORAL | Status: DC | PRN
  Administered 2024-05-04: 150 mg via ORAL
  Filled 2024-05-04: qty 1

## 2024-05-04 MED ORDER — FLUCONAZOLE 150 MG PO TABS: 150.0000 mg | ORAL_TABLET | ORAL | 0 refills | Status: DC | PRN

## 2024-05-04 NOTE — MAU Provider Note (Signed)
 CTX, LOF  S Ms. Denise [name] is a 33 y.o. G17P2002 pregnant female at [redacted]w[redacted]d who presents to MAU today with complaint of CTX and LOF. Pt states 0500 CTX started at same time of noticing new vaginal fluid leakage. States CTX now 3-56mins apart.  Denies VB.  Reports some DFM since 7/4.  Of note has stye eye and on several antibiotics. Pt reports hx of yeast infections with abx but didn't like the gel that was previously prescribed by her OBGYN. Last used gel about 2 weeks ago.   Receives care at [facility]. Prenatal records reviewed.  Pertinent items noted in HPI and remainder of comprehensive ROS otherwise negative.   O BP 112/78   Pulse 97   Temp 98.3 F (36.8 C) (Oral)   Resp 16   LMP 08/15/2023   SpO2 98%  Physical Exam Vitals and nursing note reviewed. Exam conducted with a chaperone present.  Constitutional:      General: She is not in acute distress.    Appearance: Normal appearance. She is normal weight. She is not ill-appearing.  HENT:     Head: Normocephalic and atraumatic.     Right Ear: External ear normal.     Left Ear: External ear normal.     Nose: Nose normal. No congestion.     Mouth/Throat:     Mouth: Mucous membranes are moist.     Pharynx: Oropharynx is clear.  Eyes:     Extraocular Movements: Extraocular movements intact.     Conjunctiva/sclera: Conjunctivae normal.  Cardiovascular:     Rate and Rhythm: Normal rate.  Pulmonary:     Effort: Pulmonary effort is normal. No respiratory distress.  Abdominal:     General: There is no distension.     Palpations: Abdomen is soft.     Tenderness: There is no abdominal tenderness.     Comments: gravid  Genitourinary:    General: Normal vulva.     Vagina: Vaginal discharge (thick white discharge consistent with yeast) present.     Rectum: Normal.     Comments: No pooling  Musculoskeletal:        General: No swelling. Normal range of motion.     Cervical back: Normal range of motion.  Skin:    General: Skin is warm  and dry.  Neurological:     Mental Status: She is alert and oriented to person, place, and time. Mental status is at baseline.     Motor: No weakness.     Gait: Gait normal.  Psychiatric:        Mood and Affect: Mood normal.        Behavior: Behavior normal.   NST: 135bpm, moderate variability, +accels, no decels, ctx q3mins    MDM: MAU Course:  Pt with unchanged cervical exam despite regular contractions in s/o reactive NST, neg Fern, neg pooling, neg ROM plus.  Wet prep confirms yeast infection.  Given Diflucan  150mg  here in MAU. GC collected as well.  Sent with repeat dose of Diflucan  to take 7/9. Stable for d/c.     AP #[redacted] weeks gestation #Reactive NST #Vaginal Yeast infection  Discharge from MAU in stable condition with strict/usual precautions Follow up at [facility] as scheduled for ongoing prenatal care  Allergies as of 05/04/2024       Reactions   Cephalosporins Anaphylaxis, Hives        Medication List     TAKE these medications    cephALEXin 250 MG capsule Commonly known as:  KEFLEX Take 250 mg by mouth 4 (four) times daily.   fluconazole  150 MG tablet Commonly known as: DIFLUCAN  Take 1 tablet (150 mg total) by mouth every 3 (three) days as needed (vaginal yeast infection). Take tablet no sooner than 05/07/2024.   metoCLOPramide  10 MG tablet Commonly known as: REGLAN  Take 1 tablet (10 mg total) by mouth every 6 (six) hours as needed for nausea.   multivitamin-prenatal 27-0.8 MG Tabs tablet Take 1 tablet by mouth daily at 12 noon.   ondansetron  4 MG disintegrating tablet Commonly known as: ZOFRAN -ODT Take 1 tablet (4 mg total) by mouth every 8 (eight) hours as needed for nausea or vomiting.   valACYclovir  500 MG tablet Commonly known as: VALTREX  Take 1 tablet (500 mg total) by mouth 2 (two) times daily.        Jhonny Augustin BROCKS, MD 05/04/2024 2:42 PM

## 2024-05-04 NOTE — MAU Note (Signed)
..  Denise [name] is a 33 y.o. at [redacted]w[redacted]d here in MAU reporting: contractions which started around 0500. 3-4 minutes apart. 7/10 pain.   Possible ROM at 0800. Clear fluid.   Decreased FM since Friday 7/4.   No bleeding.   Pain score: 7/10 Vitals:   05/04/24 1223  BP: 112/78  Pulse: 97  Resp: 16  Temp: 98.3 F (36.8 C)  SpO2: 100%     FHT:135 Lab orders placed from triage:  labor eval

## 2024-05-05 LAB — GC/CHLAMYDIA PROBE AMP (~~LOC~~) NOT AT ARMC
Chlamydia: NEGATIVE
Comment: NEGATIVE
Comment: NORMAL
Neisseria Gonorrhea: NEGATIVE

## 2024-05-17 DIAGNOSIS — Z3A39 39 weeks gestation of pregnancy: Secondary | ICD-10-CM | POA: Diagnosis not present

## 2024-05-18 ENCOUNTER — Inpatient Hospital Stay (HOSPITAL_COMMUNITY)
Admission: AD | Admit: 2024-05-18 | Discharge: 2024-05-20 | DRG: 807 | Disposition: A | Discharge status: Home / Self Care | Attending: Obstetrics and Gynecology | Admitting: Obstetrics and Gynecology

## 2024-05-18 ENCOUNTER — Other Ambulatory Visit: Payer: Self-pay

## 2024-05-18 ENCOUNTER — Inpatient Hospital Stay (HOSPITAL_COMMUNITY): Admitting: Anesthesiology

## 2024-05-18 ENCOUNTER — Encounter (HOSPITAL_COMMUNITY): Payer: Self-pay | Admitting: Obstetrics and Gynecology

## 2024-05-18 DIAGNOSIS — Z8249 Family history of ischemic heart disease and other diseases of the circulatory system: Secondary | ICD-10-CM | POA: Diagnosis not present

## 2024-05-18 DIAGNOSIS — Z23 Encounter for immunization: Secondary | ICD-10-CM | POA: Diagnosis not present

## 2024-05-18 DIAGNOSIS — Z6791 Unspecified blood type, Rh negative: Secondary | ICD-10-CM | POA: Diagnosis not present

## 2024-05-18 DIAGNOSIS — Z3A39 39 weeks gestation of pregnancy: Secondary | ICD-10-CM

## 2024-05-18 DIAGNOSIS — O26893 Other specified pregnancy related conditions, third trimester: Secondary | ICD-10-CM | POA: Diagnosis not present

## 2024-05-18 LAB — CBC
HCT: 36.6 % (ref 36.0–46.0)
Hemoglobin: 12.6 g/dL (ref 12.0–15.0)
MCH: 31.2 pg (ref 26.0–34.0)
MCHC: 34.4 g/dL (ref 30.0–36.0)
MCV: 90.6 fL (ref 80.0–100.0)
Platelets: 244 K/uL (ref 150–400)
RBC: 4.04 MIL/uL (ref 3.87–5.11)
RDW: 15.3 % (ref 11.5–15.5)
WBC: 9.8 K/uL (ref 4.0–10.5)
nRBC: 0 % (ref 0.0–0.2)

## 2024-05-18 LAB — TYPE AND SCREEN
ABO/RH(D): O NEG
Antibody Screen: NEGATIVE

## 2024-05-18 MED ORDER — LACTATED RINGERS IV SOLN: 500.0000 mL | INTRAVENOUS | Status: DC | PRN

## 2024-05-18 MED ORDER — IBUPROFEN 600 MG PO TABS
600.0000 mg | ORAL_TABLET | Freq: Four times a day (QID) | ORAL | Status: DC
  Administered 2024-05-19 – 2024-05-20 (×7): 600 mg via ORAL
  Filled 2024-05-18 (×7): qty 1

## 2024-05-18 MED ORDER — DIPHENHYDRAMINE HCL 25 MG PO CAPS: 25.0000 mg | ORAL_CAPSULE | Freq: Four times a day (QID) | ORAL | Status: DC | PRN

## 2024-05-18 MED ORDER — OXYCODONE-ACETAMINOPHEN 5-325 MG PO TABS: 2.0000 | ORAL_TABLET | ORAL | Status: DC | PRN

## 2024-05-18 MED ORDER — SIMETHICONE 80 MG PO CHEW
80.0000 mg | CHEWABLE_TABLET | ORAL | Status: DC | PRN
Start: 2024-05-18 — End: 2024-05-20

## 2024-05-18 MED ORDER — PRENATAL MULTIVITAMIN CH
1.0000 | ORAL_TABLET | Freq: Every day | ORAL | Status: DC
  Administered 2024-05-19 – 2024-05-20 (×2): 1 via ORAL
  Filled 2024-05-18 (×2): qty 1

## 2024-05-18 MED ORDER — FENTANYL-BUPIVACAINE-NACL 0.5-0.125-0.9 MG/250ML-% EP SOLN
EPIDURAL | Status: AC
  Filled 2024-05-18: qty 250

## 2024-05-18 MED ORDER — TETANUS-DIPHTH-ACELL PERTUSSIS 5-2.5-18.5 LF-MCG/0.5 IM SUSY: 0.5000 mL | PREFILLED_SYRINGE | Freq: Once | INTRAMUSCULAR | Status: DC

## 2024-05-18 MED ORDER — ACETAMINOPHEN 325 MG PO TABS: 650.0000 mg | ORAL_TABLET | ORAL | Status: DC | PRN

## 2024-05-18 MED ORDER — PHENYLEPHRINE 80 MCG/ML (10ML) SYRINGE FOR IV PUSH (FOR BLOOD PRESSURE SUPPORT): 80.0000 ug | PREFILLED_SYRINGE | INTRAVENOUS | Status: DC | PRN

## 2024-05-18 MED ORDER — COCONUT OIL OIL
1.0000 | TOPICAL_OIL | Status: DC | PRN
Start: 2024-05-18 — End: 2024-05-20

## 2024-05-18 MED ORDER — ONDANSETRON HCL 4 MG/2ML IJ SOLN: 4.0000 mg | Freq: Four times a day (QID) | INTRAMUSCULAR | Status: DC | PRN

## 2024-05-18 MED ORDER — SENNOSIDES-DOCUSATE SODIUM 8.6-50 MG PO TABS
2.0000 | ORAL_TABLET | ORAL | Status: DC
  Administered 2024-05-19 – 2024-05-20 (×2): 2 via ORAL
  Filled 2024-05-18 (×2): qty 2

## 2024-05-18 MED ORDER — OXYTOCIN-SODIUM CHLORIDE 30-0.9 UT/500ML-% IV SOLN
2.5000 [IU]/h | INTRAVENOUS | Status: DC
  Filled 2024-05-18: qty 500

## 2024-05-18 MED ORDER — DIBUCAINE (PERIANAL) 1 % EX OINT
1.0000 | TOPICAL_OINTMENT | CUTANEOUS | Status: DC | PRN
Start: 2024-05-18 — End: 2024-05-20

## 2024-05-18 MED ORDER — WITCH HAZEL-GLYCERIN EX PADS: 1.0000 | MEDICATED_PAD | CUTANEOUS | Status: DC | PRN

## 2024-05-18 MED ORDER — LACTATED RINGERS IV SOLN: 500.0000 mL | Freq: Once | INTRAVENOUS | Status: DC

## 2024-05-18 MED ORDER — EPHEDRINE 5 MG/ML INJ: 10.0000 mg | INTRAVENOUS | Status: DC | PRN

## 2024-05-18 MED ORDER — LIDOCAINE HCL (PF) 1 % IJ SOLN: 30.0000 mL | INTRAMUSCULAR | Status: DC | PRN

## 2024-05-18 MED ORDER — BENZOCAINE-MENTHOL 20-0.5 % EX AERO: 1.0000 | INHALATION_SPRAY | CUTANEOUS | Status: DC | PRN

## 2024-05-18 MED ORDER — SOD CITRATE-CITRIC ACID 500-334 MG/5ML PO SOLN: 30.0000 mL | ORAL | Status: DC | PRN

## 2024-05-18 MED ORDER — DIPHENHYDRAMINE HCL 50 MG/ML IJ SOLN: 12.5000 mg | INTRAMUSCULAR | Status: DC | PRN

## 2024-05-18 MED ORDER — ONDANSETRON HCL 4 MG/2ML IJ SOLN: 4.0000 mg | INTRAMUSCULAR | Status: DC | PRN

## 2024-05-18 MED ORDER — ZOLPIDEM TARTRATE 5 MG PO TABS: 5.0000 mg | ORAL_TABLET | Freq: Every evening | ORAL | Status: DC | PRN

## 2024-05-18 MED ORDER — OXYTOCIN BOLUS FROM INFUSION
333.0000 mL | Freq: Once | INTRAVENOUS | Status: AC
  Administered 2024-05-18: 333 mL via INTRAVENOUS

## 2024-05-18 MED ORDER — OXYCODONE-ACETAMINOPHEN 5-325 MG PO TABS: 1.0000 | ORAL_TABLET | ORAL | Status: DC | PRN

## 2024-05-18 MED ORDER — FLEET ENEMA RE ENEM
1.0000 | ENEMA | RECTAL | Status: DC | PRN
Start: 2024-05-18 — End: 2024-05-18

## 2024-05-18 MED ORDER — FENTANYL-BUPIVACAINE-NACL 0.5-0.125-0.9 MG/250ML-% EP SOLN: 12.0000 mL/h | EPIDURAL | Status: DC | PRN

## 2024-05-18 MED ORDER — ONDANSETRON HCL 4 MG PO TABS: 4.0000 mg | ORAL_TABLET | ORAL | Status: DC | PRN

## 2024-05-18 NOTE — Progress Notes (Signed)
 Labor Progress Note  Patient comfortable with epidural, cervix 6-7 / 100 / -1, bulging bag. Head well applied.  AROM completed in standard fashion with return of large amount of clear fluid.   FHT cat 1 with accels, anticipate vaginal delivery  Denise [name]

## 2024-05-18 NOTE — Anesthesia Procedure Notes (Signed)
 Epidural Patient location during procedure: OB Start time: 05/18/2024 7:29 PM End time: 05/18/2024 7:40 PM  Staffing Anesthesiologist: Jefm Garnette LABOR, MD Performed: anesthesiologist   Preanesthetic Checklist Completed: patient identified, IV checked, site marked, risks and benefits discussed, surgical consent, monitors and equipment checked, pre-op evaluation and timeout performed  Epidural Patient position: sitting Prep: DuraPrep and site prepped and draped Patient monitoring: continuous pulse ox and blood pressure Approach: midline Location: L2-L3 Injection technique: LOR air  Needle:  Needle type: Tuohy  Needle gauge: 17 G Needle length: 9 cm and 9 Needle insertion depth: 6 cm Catheter type: closed end flexible Catheter size: 19 Gauge Catheter at skin depth: 11 cm Test dose: negative  Assessment Events: blood not aspirated, no cerebrospinal fluid, injection not painful, no injection resistance, no paresthesia and negative IV test  Additional Notes Patient identified. Risks/Benefits/Options discussed with patient including but not limited to bleeding, infection, nerve damage, paralysis, failed block, incomplete pain control, headache, blood pressure changes, nausea, vomiting, reactions to medication both or allergic, itching and postpartum back pain. Confirmed with bedside nurse the patient's most recent platelet count. Confirmed with patient that they are not currently taking any anticoagulation, have any bleeding history or any family history of bleeding disorders. Patient expressed understanding and wished to proceed. All questions were answered. Sterile technique was used throughout the entire procedure. Please see nursing notes for vital signs. Test dose was given through epidural needle and negative prior to continuing to dose epidural or start infusion. Warning signs of high block given to the patient including shortness of breath, tingling/numbness in hands, complete  motor block, or any concerning symptoms with instructions to call for help. Patient was given instructions on fall risk and not to get out of bed. All questions and concerns addressed with instructions to call with any issues.  1` Attempt (S) . Patient tolerated procedure well.

## 2024-05-18 NOTE — Anesthesia Preprocedure Evaluation (Signed)
 Anesthesia Evaluation  Patient identified by MRN, date of birth, ID band Patient awake    Reviewed: Allergy & Precautions, Patient's Chart, lab work & pertinent test results  Airway Mallampati: II  TM Distance: >3 FB Neck ROM: Full    Dental no notable dental hx. (+) Teeth Intact, Dental Advisory Given   Pulmonary neg pulmonary ROS   Pulmonary exam normal breath sounds clear to auscultation       Cardiovascular negative cardio ROS Normal cardiovascular exam Rhythm:Regular Rate:Normal     Neuro/Psych  Headaches  Anxiety Depression     negative psych ROS   GI/Hepatic negative GI ROS, Neg liver ROS,,,  Endo/Other  negative endocrine ROS    Renal/GU negative Renal ROS     Musculoskeletal   Abdominal   Peds  Hematology Lab Results      Component                Value               Date                      WBC                      9.8                 05/18/2024                HGB                      12.6                05/18/2024                HCT                      36.6                05/18/2024                MCV                      90.6                05/18/2024                PLT                      244                 05/18/2024              Anesthesia Other Findings All: cephlosporins  Reproductive/Obstetrics (+) Pregnancy                              Anesthesia Physical Anesthesia Plan  ASA: 2  Anesthesia Plan: Epidural   Post-op Pain Management:    Induction:   PONV Risk Score and Plan: Treatment may vary due to age or medical condition and TIVA  Airway Management Planned:   Additional Equipment:   Intra-op Plan:   Post-operative Plan:   Informed Consent: I have reviewed the patients History and Physical, chart, labs and discussed the procedure including the risks, benefits and alternatives for the proposed anesthesia with the patient or authorized representative  who has indicated his/her understanding  and acceptance.       Plan Discussed with:   Anesthesia Plan Comments: (39.2 wk G3p2 for LEA)         Anesthesia Quick Evaluation

## 2024-05-18 NOTE — H&P (Signed)
 OB History and Physical   Denise [name] is a 33 y.o. female [phone] presenting for active labor, 6 cm dilation at [redacted]w[redacted]d. She was previously 4 cm in office.   Pregnancy course has been uncomplicated.   Rh negative (Fetus Rh negative), GBS negative, Panorama low risk.   OB History     Gravida  3   Para  2   Term  2   Preterm  0   AB  0   Living  2      SAB  0   IAB  0   Ectopic  0   Multiple  0   Live Births  2          Past Medical History:  Diagnosis Date   ADHD (attention deficit hyperactivity disorder)    Anxiety    Depression    Genital herpes    Migraines    Past Surgical History:  Procedure Laterality Date   NO PAST SURGERIES     Family History: family history includes Alzheimer's disease in her maternal grandmother; Breast cancer (age of onset: 23) in her maternal aunt, maternal aunt, and mother; Cancer in an other family member; Heart attack in her paternal grandfather; Hyperthyroidism in her mother; Lung cancer in her maternal grandfather; Multiple sclerosis in her maternal aunt and mother; Skin cancer in her father; Stroke in her paternal grandmother and another family member. Social History:  reports that she has never smoked. She has never used smokeless tobacco. She reports that she does not currently use alcohol. She reports that she does not currently use drugs after having used the following drugs: Marijuana. Frequency: 1.00 time per week.     Maternal Diabetes: No Genetic Screening: Normal Maternal Ultrasounds/Referrals: Normal Fetal Ultrasounds or other Referrals:  None Maternal Substance Abuse:  No Significant Maternal Medications:  None Significant Maternal Lab Results:  Group B Strep negative and Rh negative Other Comments:  None  Review of Systems - Patient denies fever, chills, SOB, CP, N/V/D.  History Dilation: 6 Effacement (%): 80 Station: -2 Exam by:: [employer], RN Blood pressure 120/84, pulse 100, temperature (!)  97.4 F (36.3 C), temperature source Oral, resp. rate 18, last menstrual period 08/15/2023, SpO2 100%, unknown if currently breastfeeding. Exam Physical Exam   Gen: alert, well appearing, no distress Chest: nonlabored breathing CV: no peripheral edema Abdomen: soft, gravid  Ext: no evidence of DVT  Prenatal labs: ABO, Rh: --/--/PENDING (07/20 1904) Antibody: PENDING (07/20 1904) Rubella:   RPR:    HBsAg:    HIV:    GBS:     Assessment/Plan: Admit to Labor and Delivery Ctx strong, will await stat labs for epidural Anticipate vaginal delivery    Denise [name] 05/18/2024, 7:20 PM

## 2024-05-18 NOTE — MAU Note (Signed)
..  Denise [name] is a 33 y.o. at [redacted]w[redacted]d here in MAU reporting: started having contractions an hour ago that are now every few minutes. Denies vaginal bleeding or leaking of fluid. +FM GBS neg. Desires Epidural.   Pain score: 10/10  QYU:jeeopzi in room 125

## 2024-05-19 LAB — CBC
HCT: 28.5 % — ABNORMAL LOW (ref 36.0–46.0)
Hemoglobin: 9.8 g/dL — ABNORMAL LOW (ref 12.0–15.0)
MCH: 31.6 pg (ref 26.0–34.0)
MCHC: 34.4 g/dL (ref 30.0–36.0)
MCV: 91.9 fL (ref 80.0–100.0)
Platelets: 176 K/uL (ref 150–400)
RBC: 3.1 MIL/uL — ABNORMAL LOW (ref 3.87–5.11)
RDW: 15.3 % (ref 11.5–15.5)
WBC: 12 K/uL — ABNORMAL HIGH (ref 4.0–10.5)
nRBC: 0 % (ref 0.0–0.2)

## 2024-05-19 LAB — RPR: RPR Ser Ql: NONREACTIVE

## 2024-05-19 NOTE — Progress Notes (Signed)
 Post Partum Day 1 Subjective: no complaints, up ad lib, voiding, tolerating PO, and + flatus  Objective: Blood pressure 118/83, pulse 60, temperature 97.7 F (36.5 C), temperature source Oral, resp. rate 18, last menstrual period 08/15/2023, SpO2 99%, unknown if currently breastfeeding.  Physical Exam:  General: alert, cooperative, and no distress Lochia: appropriate Uterine Fundus: firm Incision: healing well DVT Evaluation: No evidence of DVT seen on physical exam.  Recent Labs    05/18/24 1904 05/19/24 0654  HGB 12.6 9.8*  HCT 36.6 28.5*    Assessment/Plan: Plan for discharge tomorrow   LOS: 1 day   Lynwood FORBES Clubs II, MD 05/19/2024, 8:04 AM

## 2024-05-19 NOTE — Anesthesia Postprocedure Evaluation (Signed)
 Anesthesia Post Note  Patient: DENISHA HOEL  Procedure(s) Performed: AN AD HOC LABOR EPIDURAL     Patient location during evaluation: Mother Baby Anesthesia Type: Epidural Level of consciousness: awake and alert Pain management: pain level controlled Vital Signs Assessment: post-procedure vital signs reviewed and stable Respiratory status: spontaneous breathing, nonlabored ventilation and respiratory function stable Cardiovascular status: stable Postop Assessment: no headache, no backache, epidural receding and able to ambulate Anesthetic complications: no   No notable events documented.  Last Vitals:  Vitals:   05/19/24 0357 05/19/24 0758  BP: 106/73 118/83  Pulse: 69 60  Resp: 18 18  Temp: 36.6 C 36.5 C  SpO2: 99%     Last Pain:  Vitals:   05/19/24 0758  TempSrc: Oral  PainSc: 0-No pain   Pain Goal:                Epidural/Spinal Function Cutaneous sensation: Normal sensation (05/19/24 0758), Patient able to flex knees: Yes (05/19/24 0758), Patient able to lift hips off bed: Yes (05/19/24 0758), Back pain beyond tenderness at insertion site: No (05/19/24 0758), Progressively worsening motor and/or sensory loss: No (05/19/24 0758), Bowel and/or bladder incontinence post epidural: No (05/19/24 0758)  Alice Vitelli

## 2024-05-20 NOTE — Discharge Summary (Signed)
 Postpartum Discharge Summary  Date of Service May 20, 2024     Patient Name: Denise [name] DOB: 18-Nov-1990 MRN: [id number]  Date of admission: 05/18/2024 Delivery date:05/18/2024 Delivering provider: LEQUITA LYE A Date of discharge: 05/20/2024  Admitting diagnosis: Normal labor [O80, Z37.9] Intrauterine pregnancy: [redacted]w[redacted]d     Secondary diagnosis:  Active Problems:   Normal labor  Additional problems: NONE    Discharge diagnosis: Term Pregnancy Delivered                                              Post partum procedures:NOT APPLICABLE Augmentation: AROM Complications: None  Hospital course: Onset of Labor With Vaginal Delivery      33 y.o. yo [id number] at [redacted]w[redacted]d was admitted in Active Labor on 05/18/2024. Labor course was complicated by NOTHING  Membrane Rupture Time/Date: 8:35 PM,05/18/2024  Delivery Method:Vaginal, Spontaneous Operative Delivery:N/A Episiotomy: None Lacerations:  1st degree Patient had a postpartum course complicated by  NOTHING.  She is ambulating, tolerating a regular diet, passing flatus, and urinating well. Patient is discharged home in stable condition on 05/20/24.  Newborn Data: Birth date:05/18/2024 Birth time:9:07 PM Gender:Female Living status:Living Apgars:9 ,9  Weight:3232 g  Magnesium  Sulfate received: No BMZ received: No Rhophylac :N/A MMR:N/A T-DaP:Given prenatally Flu: N/A RSV Vaccine received: No Transfusion:No Immunizations administered: Immunization History  Administered Date(s) Administered   Influenza,inj,Quad PF,6+ Mos 01/29/2017   Influenza-Unspecified 01/29/2017   MMR 04/20/2017   Tdap 01/04/2011, 01/29/2017, 05/01/2019    Physical exam  Vitals:   05/19/24 0758 05/19/24 1537 05/19/24 2020 05/20/24 0528  BP: 118/83 113/77 124/63 112/81  Pulse: 60 70 81 68  Resp: 18 17 18 17   Temp: 97.7 F (36.5 C) 98.2 F (36.8 C) 98.2 F (36.8 C) 98.2 F (36.8 C)  TempSrc: Oral Oral Oral Oral  SpO2:   98% 99%    General: alert, cooperative, and no distress Lochia: appropriate Uterine Fundus: firm Incision: Healing well with no significant drainage DVT Evaluation: No evidence of DVT seen on physical exam. Labs: Lab Results  Component Value Date   WBC 12.0 (H) 05/19/2024   HGB 9.8 (L) 05/19/2024   HCT 28.5 (L) 05/19/2024   MCV 91.9 05/19/2024   PLT 176 05/19/2024      Latest Ref Rng & Units 07/22/2023    7:32 AM  CMP  Glucose 70 - 99 mg/dL 859   BUN 6 - 20 mg/dL 10   Creatinine 9.55 - 1.00 mg/dL 9.13   Sodium 864 - 854 mmol/L 140   Potassium 3.5 - 5.1 mmol/L 3.5   Chloride 98 - 111 mmol/L 106   CO2 22 - 32 mmol/L 23   Calcium 8.9 - 10.3 mg/dL 9.7   Total Protein 6.5 - 8.1 g/dL 7.8   Total Bilirubin 0.3 - 1.2 mg/dL 0.5   Alkaline Phos 38 - 126 U/L 57   AST 15 - 41 U/L 14   ALT 0 - 44 U/L 10    Edinburgh Score:    05/19/2024    5:54 PM  Edinburgh Postnatal Depression Scale Screening Tool  I have been able to laugh and see the funny side of things. 1  I have looked forward with enjoyment to things. 2  I have blamed myself unnecessarily when things went wrong. 3  I have been anxious or worried for no good reason. 2  I have felt scared or panicky for no good reason. 1  Things have been getting on top of me. 2  I have been so unhappy that I have had difficulty sleeping. 0  I have felt sad or miserable. 2  I have been so unhappy that I have been crying. 1  The thought of harming myself has occurred to me. 0  Edinburgh Postnatal Depression Scale Total 14      After visit meds:  Allergies as of 05/20/2024       Reactions   Cephalosporins Anaphylaxis, Hives        Medication List     STOP taking these medications    cephALEXin 250 MG capsule Commonly known as: KEFLEX   fluconazole  150 MG tablet Commonly known as: DIFLUCAN    metoCLOPramide  10 MG tablet Commonly known as: REGLAN    ondansetron  4 MG disintegrating tablet Commonly known as: ZOFRAN -ODT    valACYclovir  500 MG tablet Commonly known as: VALTREX        TAKE these medications    multivitamin-prenatal 27-0.8 MG Tabs tablet Take 1 tablet by mouth daily at 12 noon.         Discharge home in stable condition Infant Feeding: Breast Infant Disposition:home with mother Discharge instruction: per After Visit Summary and Postpartum booklet. Activity: Advance as tolerated. Pelvic rest for 6 weeks.  Diet: routine diet Anticipated Birth Control: Unsure Postpartum Appointment:6 weeks Additional Postpartum F/U: NOT APPLICABLE Future Appointments:No future appointments. Follow up Visit:      05/20/2024 Rosaline LITTIE Cobble, MD

## 2024-05-20 NOTE — Progress Notes (Signed)
 CSW received consult due to score 14 on [place] Depression Screen; and hx of ADHD,GAD, MDD and PPD. CSW met MOB at bedside to complete assessment and offer support. CSW entered the room, introduced herself and acknowledged that FOB sleeping on the couch. MOB gave CSW verbal permission to speak about anything while he was present. CSW explained her role and the reason for the visit. MOB presented laying in bonding/feeding the infant and was  polite, easy to engage, receptive to meeting with CSW, and appeared forthcoming.  CSW acknowledged Edinburgh score of 14. Patient declines a referral to [employer]. Patient verbalizes understanding that the appointment will be virtual. CSW inquired about MOB's mental health history. MOB reported being diagnosed with anxiety and depression in 2017 due to a family member committing suicide. MOB reported participating in therapy and found the support no helpful. 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 MOB reported 2020 she birthed her second child and experienced PPD with symptoms that included not feeling like enough/ good mom and constant sadness. MOB reported for 4 months these symptoms were constant in 2020 and she reached out for support and began Fluoxetine . MOB reported she discontinued her Fluoxetine  during this current pregnancy and plans to restart the medication during her PP period. MOB reported her supports as FOB and her family. CSW provided education regarding Baby Blues vs PMADs and provided MOB with resources for mental health follow up.  CSW encouraged MOB to evaluate her mental health throughout the postpartum period with the use of the New Mom Checklist developed by Postpartum Progress as well as the [place] Postnatal Depression Scale and notify a medical professional if symptoms arise. CSW assessed for safety with MOB SI and HI; MOB denied all.  CSW did not assess for DV; FOB was present.  CSW asked MOB has she selected a pediatrician for the infant's follow up visits; MOB said [facility]. MOB reported having all essential items for the infant including a carseat, bassinet and crib for safe sleeping. CSW provided review of Sudden Infant Death Syndrome (SIDS) precautions.  CSW identifies no further need for intervention and no barriers to discharge at this time.  Rosina Molt, [place] Clinical Social Worker [phone]

## 2024-05-22 DIAGNOSIS — Z00110 Health examination for newborn under 8 days old: Secondary | ICD-10-CM | POA: Diagnosis not present

## 2024-05-24 LAB — BIRTH TISSUE RECOVERY COLLECTION (PLACENTA DONATION)

## 2024-05-25 DIAGNOSIS — Z00110 Health examination for newborn under 8 days old: Secondary | ICD-10-CM | POA: Diagnosis not present

## 2024-06-04 ENCOUNTER — Telehealth (HOSPITAL_COMMUNITY): Payer: Self-pay | Admitting: *Deleted

## 2024-06-04 NOTE — Telephone Encounter (Signed)
 06/04/2024  Name: Denise [name] MRN: [id number] DOB: Mar 15, 1991  Reason for Call:  Transition of Care Hospital Discharge Call  Contact Status: Patient Contact Status: Message  Language assistant needed:          Follow-Up Questions:    [place] Postnatal Depression Scale:  In the Past 7 Days:    PHQ2-9 Depression Scale:     Discharge Follow-up:    Post-discharge interventions: NA  Mliss Sieve, RN 06/04/2024 16:02

## 2024-07-21 ENCOUNTER — Ambulatory Visit: Payer: Self-pay

## 2024-07-21 NOTE — Telephone Encounter (Signed)
 FYI Only or Action Required?: Action required by provider: request for appointment.  Patient was last seen in primary care on 02/14/2024 by Billy Philippe SAUNDERS, NP.  Called Nurse Triage reporting Back Pain.  Symptoms began several days ago.  Interventions attempted: OTC medications: Tylenol .  Symptoms are: gradually worsening.Back and shoulder pain that causes arm numbness. Appointment made.  Triage Disposition: See PCP When Office is Open (Within 3 Days)  Patient/caregiver understands and will follow disposition?: Yes    Copied from CRM #[id number]. Topic: Clinical - Red Word Triage >> Jul 21, 2024 10:38 AM Franky GRADE wrote: Red Word that prompted transfer to Nurse Triage: Patient believed she may have pulled a muscle in her back as she was experiencing pain, she states the discomfort moved and now feels chest tightness and numbness In the arms. Reason for Disposition  [1] MODERATE back pain (e.g., interferes with normal activities) AND [2] present > 3 days  Answer Assessment - Initial Assessment Questions 1. ONSET: When did the pain begin? (e.g., minutes, hours, days)     9 days ago 2. LOCATION: Where does it hurt? (upper, mid or lower back)     Right shoulder 3. SEVERITY: How bad is the pain?  (e.g., Scale 1-10; mild, moderate, or severe)     8 4. PATTERN: Is the pain constant? (e.g., yes, no; constant, intermittent)      constant 5. RADIATION: Does the pain shoot into your legs or somewhere else?     arms 6. CAUSE:  What do you think is causing the back pain?      unsure 7. BACK OVERUSE:  Any recent lifting of heavy objects, strenuous work or exercise?     no 8. MEDICINES: What have you taken so far for the pain? (e.g., nothing, acetaminophen , NSAIDS)     Tylenol  9. NEUROLOGIC SYMPTOMS: Do you have any weakness, numbness, or problems with bowel/bladder control?     Arms get numb 10. OTHER SYMPTOMS: Do you have any other symptoms? (e.g., fever, abdomen  pain, burning with urination, blood in urine)       no 11. PREGNANCY: Is there any chance you are pregnant? When was your last menstrual period?       no  Protocols used: Back Pain-A-AH

## 2024-07-23 ENCOUNTER — Ambulatory Visit (INDEPENDENT_AMBULATORY_CARE_PROVIDER_SITE_OTHER): Admitting: Family Medicine

## 2024-07-23 ENCOUNTER — Encounter: Payer: Self-pay | Admitting: Family Medicine

## 2024-07-23 VITALS — BP 118/78 | HR 76 | Temp 98.0°F | Ht 63.0 in | Wt 108.0 lb

## 2024-07-23 DIAGNOSIS — R202 Paresthesia of skin: Secondary | ICD-10-CM

## 2024-07-23 DIAGNOSIS — R200 Anesthesia of skin: Secondary | ICD-10-CM | POA: Diagnosis not present

## 2024-07-23 DIAGNOSIS — M25511 Pain in right shoulder: Secondary | ICD-10-CM | POA: Diagnosis not present

## 2024-07-23 DIAGNOSIS — R252 Cramp and spasm: Secondary | ICD-10-CM

## 2024-07-23 LAB — COMPREHENSIVE METABOLIC PANEL WITH GFR
ALT: 13 U/L (ref 0–35)
AST: 14 U/L (ref 0–37)
Albumin: 4.3 g/dL (ref 3.5–5.2)
Alkaline Phosphatase: 47 U/L (ref 39–117)
BUN: 14 mg/dL (ref 6–23)
CO2: 28 meq/L (ref 19–32)
Calcium: 9.3 mg/dL (ref 8.4–10.5)
Chloride: 106 meq/L (ref 96–112)
Creatinine, Ser: 0.87 mg/dL (ref 0.40–1.20)
GFR: 89.08 mL/min (ref >60.00)
Glucose, Bld: 78 mg/dL (ref 70–99)
Potassium: 4.1 meq/L (ref 3.5–5.1)
Sodium: 140 meq/L (ref 135–145)
Total Bilirubin: 0.3 mg/dL (ref 0.2–1.2)
Total Protein: 6.8 g/dL (ref 6.0–8.3)

## 2024-07-23 LAB — MAGNESIUM: Magnesium: 1.7 mg/dL (ref 1.5–2.5)

## 2024-07-23 MED ORDER — PREDNISONE 10 MG PO TABS: ORAL_TABLET | ORAL | 0 refills | Status: AC

## 2024-07-23 MED ORDER — METHOCARBAMOL 500 MG IVPB - SIMPLE MED: 500.0000 mg | Freq: Three times a day (TID) | INTRAVENOUS | 0 refills | Status: DC | PRN

## 2024-07-23 MED ORDER — METHOCARBAMOL 500 MG PO TABS: 500.0000 mg | ORAL_TABLET | Freq: Three times a day (TID) | ORAL | 0 refills | Status: AC | PRN

## 2024-07-23 NOTE — Patient Instructions (Addendum)
-  It was good to see you today! Congratulations once again on your [place] baby girl. She is beautiful. -Prescribed Prednisone  10mg , 6 day taper for pain. Please do not take any NSAIDS (Ibuprofen , Advil , Aleve , or Naproxen ) while taking medication.  -Prescribed Robaxin  500mg  tablet, take 1 tablet every 8 hours as needed. Caution: Medication can cause drowsiness.  -Ordered CMP to check potassium and magnesium  for a reason of muscle cramps. Office will call with lab results and will be available on MyChart.  -Follow up if not improved.

## 2024-07-23 NOTE — Progress Notes (Signed)
 Established Patient Office Visit   Subjective:  Patient ID: Denise [name], female    DOB: 26-Oct-1991  Age: 33 y.o. MRN: [id number]  Chief Complaint  Patient presents with   Back Pain    HPI: Patient is complaining of right shoulder blade pain that radiates to right anterior chest. This started about 1.5 weeks ago. Also, reports she has had muscle cramps in her triceps. Then, last night when lying down, she turned her neck to the right, she had numbness and tingling in upper extremities for about 2 minutes. Pain in the right shoulder blade is intermittent,. Described as burning, aching pain.   She reports she went back to work 3 weeks from having a child. She lifts heavy trays at work.   She  Review of Systems  Musculoskeletal:  Positive for back pain.   See HPI above     Objective:   BP 118/78   Pulse 76   Temp 98 F (36.7 C) (Oral)   Ht 5' 3 (1.6 m)   Wt 108 lb (49 kg)   LMP 08/15/2023   SpO2 98%   BMI 19.13 kg/m    Physical Exam Vitals reviewed.  Constitutional:      General: She is not in acute distress.    Appearance: Normal appearance. She is not ill-appearing, toxic-appearing or diaphoretic.  HENT:     Head: Normocephalic and atraumatic.  Eyes:     General:        Right eye: No discharge.        Left eye: No discharge.     Conjunctiva/sclera: Conjunctivae normal.  Cardiovascular:     Rate and Rhythm: Normal rate and regular rhythm.     Heart sounds: Normal heart sounds. No murmur heard.    No friction rub. No gallop.  Pulmonary:     Effort: Pulmonary effort is normal. No respiratory distress.     Breath sounds: Normal breath sounds.  Musculoskeletal:        General: Normal range of motion.     Right shoulder: Tenderness present. No swelling, deformity or crepitus. Normal range of motion.     Cervical back: No tenderness.     Thoracic back: No tenderness.     Lumbar back: No tenderness.  Skin:    General: Skin is warm and dry.  Neurological:      General: No focal deficit present.     Mental Status: She is alert and oriented to person, place, and time. Mental status is at baseline.  Psychiatric:        Mood and Affect: Mood normal.        Behavior: Behavior normal.        Thought Content: Thought content normal.        Judgment: Judgment normal.      Assessment & Plan:  Muscle cramps -     Magnesium  -     Comprehensive metabolic panel with GFR -     Methocarbamol ; Take 1 tablet (500 mg total) by mouth every 8 (eight) hours as needed for up to 7 days for muscle spasms.  Dispense: 21 tablet; Refill: 0  Right shoulder pain, unspecified chronicity -     predniSONE ; Take 6 tablets (60 mg total) by mouth daily with breakfast for 1 day, THEN 5 tablets (50 mg total) daily with breakfast for 1 day, THEN 4 tablets (40 mg total) daily with breakfast for 1 day, THEN 3 tablets (30 mg total) daily with breakfast for  1 day, THEN 2 tablets (20 mg total) daily with breakfast for 1 day, THEN 1 tablet (10 mg total) daily with breakfast for 1 day.  Dispense: 21 tablet; Refill: 0 -     Methocarbamol ; Take 1 tablet (500 mg total) by mouth every 8 (eight) hours as needed for up to 7 days for muscle spasms.  Dispense: 21 tablet; Refill: 0  Numbness and tingling of both upper extremities -     predniSONE ; Take 6 tablets (60 mg total) by mouth daily with breakfast for 1 day, THEN 5 tablets (50 mg total) daily with breakfast for 1 day, THEN 4 tablets (40 mg total) daily with breakfast for 1 day, THEN 3 tablets (30 mg total) daily with breakfast for 1 day, THEN 2 tablets (20 mg total) daily with breakfast for 1 day, THEN 1 tablet (10 mg total) daily with breakfast for 1 day.  Dispense: 21 tablet; Refill: 0  -Prescribed Prednisone  10mg , 6 day taper for pain. Please do not take any NSAIDS (Ibuprofen , Advil , Aleve , or Naproxen ) while taking medication.  -Prescribed Robaxin  500mg  tablet, take 1 tablet every 8 hours as needed. Caution: Medication can cause  drowsiness.  -Ordered CMP to check potassium and magnesium  for a reason of muscle cramps. Office will call with lab results and will be available on MyChart.  -Based on assessment and subjective data, this is muscle related with possible nerve impingement.  -Follow up if not improved.   Skila Rollins, NP

## 2024-07-24 ENCOUNTER — Ambulatory Visit: Payer: Self-pay | Admitting: Family Medicine
